# Patient Record
Sex: Female | Born: 1990 | Race: Black or African American | Hispanic: No | Marital: Single | Smoking: Former smoker
Health system: Southern US, Community
[De-identification: ages and names within clinical notes are randomized; demographics above are authoritative.]

## PROBLEM LIST (undated history)

## (undated) DIAGNOSIS — D649 Anemia, unspecified: Secondary | ICD-10-CM

---

## 2010-05-13 ENCOUNTER — Emergency Department (HOSPITAL_BASED_OUTPATIENT_CLINIC_OR_DEPARTMENT_OTHER): Admission: EM | Admit: 2010-05-13 | Discharge: 2010-05-13 | Payer: Self-pay | Admitting: Emergency Medicine

## 2014-10-01 ENCOUNTER — Emergency Department (HOSPITAL_BASED_OUTPATIENT_CLINIC_OR_DEPARTMENT_OTHER)
Admission: EM | Admit: 2014-10-01 | Discharge: 2014-10-01 | Disposition: A | Discharge status: Home / Self Care | Payer: Self-pay | Attending: Emergency Medicine | Admitting: Emergency Medicine

## 2014-10-01 ENCOUNTER — Encounter (HOSPITAL_BASED_OUTPATIENT_CLINIC_OR_DEPARTMENT_OTHER): Payer: Self-pay | Admitting: Emergency Medicine

## 2014-10-01 DIAGNOSIS — N760 Acute vaginitis: Secondary | ICD-10-CM | POA: Insufficient documentation

## 2014-10-01 DIAGNOSIS — Z720 Tobacco use: Secondary | ICD-10-CM | POA: Insufficient documentation

## 2014-10-01 DIAGNOSIS — R102 Pelvic and perineal pain: Secondary | ICD-10-CM

## 2014-10-01 DIAGNOSIS — Z862 Personal history of diseases of the blood and blood-forming organs and certain disorders involving the immune mechanism: Secondary | ICD-10-CM | POA: Insufficient documentation

## 2014-10-01 DIAGNOSIS — B9689 Other specified bacterial agents as the cause of diseases classified elsewhere: Secondary | ICD-10-CM

## 2014-10-01 DIAGNOSIS — Z3202 Encounter for pregnancy test, result negative: Secondary | ICD-10-CM | POA: Insufficient documentation

## 2014-10-01 HISTORY — DX: Anemia, unspecified: D64.9

## 2014-10-01 LAB — URINALYSIS, ROUTINE W REFLEX MICROSCOPIC
Bilirubin Urine: NEGATIVE
Glucose, UA: NEGATIVE mg/dL
Hgb urine dipstick: NEGATIVE
Ketones, ur: NEGATIVE mg/dL
LEUKOCYTES UA: NEGATIVE
NITRITE: NEGATIVE
PROTEIN: NEGATIVE mg/dL
SPECIFIC GRAVITY, URINE: 1.021 (ref 1.005–1.030)
UROBILINOGEN UA: 1 mg/dL (ref 0.0–1.0)
pH: 7 (ref 5.0–8.0)

## 2014-10-01 LAB — WET PREP, GENITAL
Trich, Wet Prep: NONE SEEN
YEAST WET PREP: NONE SEEN

## 2014-10-01 LAB — PREGNANCY, URINE: PREG TEST UR: NEGATIVE

## 2014-10-01 MED ORDER — METRONIDAZOLE 500 MG PO TABS: 500.0000 mg | ORAL_TABLET | Freq: Two times a day (BID) | ORAL | Status: DC

## 2014-10-01 NOTE — Discharge Instructions (Signed)
Bacterial Vaginosis Bacterial vaginosis is a vaginal infection that occurs when the normal balance of bacteria in the vagina is disrupted. It results from an overgrowth of certain bacteria. This is the most common vaginal infection in women of childbearing age. Treatment is important to prevent complications, especially in pregnant women, as it can cause a premature delivery. CAUSES  Bacterial vaginosis is caused by an increase in harmful bacteria that are normally present in smaller amounts in the vagina. Several different kinds of bacteria can cause bacterial vaginosis. However, the reason that the condition develops is not fully understood. RISK FACTORS Certain activities or behaviors can put you at an increased risk of developing bacterial vaginosis, including:  Having a new sex partner or multiple sex partners.  Douching.  Using an intrauterine device (IUD) for contraception. Women do not get bacterial vaginosis from toilet seats, bedding, swimming pools, or contact with objects around them. SIGNS AND SYMPTOMS  Some women with bacterial vaginosis have no signs or symptoms. Common symptoms include:  Grey vaginal discharge.  A fishlike odor with discharge, especially after sexual intercourse.  Itching or burning of the vagina and vulva.  Burning or pain with urination. DIAGNOSIS  Your health care provider will take a medical history and examine the vagina for signs of bacterial vaginosis. A sample of vaginal fluid may be taken. Your health care provider will look at this sample under a microscope to check for bacteria and abnormal cells. A vaginal pH test may also be done.  TREATMENT  Bacterial vaginosis may be treated with antibiotic medicines. These may be given in the form of a pill or a vaginal cream. A second round of antibiotics may be prescribed if the condition comes back after treatment.  HOME CARE INSTRUCTIONS   Only take over-the-counter or prescription medicines as  directed by your health care provider.  If antibiotic medicine was prescribed, take it as directed. Make sure you finish it even if you start to feel better.  Do not have sex until treatment is completed.  Tell all sexual partners that you have a vaginal infection. They should see their health care provider and be treated if they have problems, such as a mild rash or itching.  Practice safe sex by using condoms and only having one sex partner. SEEK MEDICAL CARE IF:   Your symptoms are not improving after 3 days of treatment.  You have increased discharge or pain.  You have a fever. MAKE SURE YOU:   Understand these instructions.  Will watch your condition.  Will get help right away if you are not doing well or get worse. FOR MORE INFORMATION  Centers for Disease Control and Prevention, Division of STD Prevention: [employer]raud[employer] American Sexual Health Association (ASHA): www.ashastd.org  Document Released: 12/17/2005 Document Revised: 10/07/2013 Document Reviewed: 07/29/2013 [facility] Patient Information 2015 [place], [place]. This information is not intended to replace advice given to you by your health care provider. Make sure you discuss any questions you have with your health care provider.  Pelvic Pain Pelvic pain is pain felt below the belly button and between your hips. It can be caused by many different things. It is important to get help right away. This is especially true for severe, sharp, or unusual pain that comes on suddenly.  HOME CARE  Only take medicine as told by your doctor.  Rest as told by your doctor.  Eat a healthy diet, such as fruits, vegetables, and lean meats.  Drink enough fluids to keep your pee (  urine) clear or pale yellow, or as told.  Avoid sex (intercourse) if it causes pain.  Apply warm or cold packs to your lower belly (abdomen). Use the type of pack that helps the pain.  Avoid situations that cause you stress.  Keep a journal to track  your pain. Write down:  When the pain started.  Where it is located.  If there are things that seem to be related to the pain, such as food or your period.  Follow up with your doctor as told. GET HELP RIGHT AWAY IF:   You have heavy bleeding from the vagina.  You have more pelvic pain.  You feel lightheaded or pass out (faint).  You have chills.  You have pain when you pee or have blood in your pee.  You cannot stop having watery poop (diarrhea).  You cannot stop throwing up (vomiting).  You have a fever or lasting symptoms for more than 3 days.  You have a fever and your symptoms suddenly get worse.  You are being physically or sexually abused.  Your medicine does not help your pain.  You have fluid (discharge) coming from your vagina that is not normal. MAKE SURE YOU:  Understand these instructions.  Will watch your condition.  Will get help if you are not doing well or get worse. Document Released: 06/04/2008 Document Revised: 06/17/2012 Document Reviewed: 04/07/2012 [facility] Patient Information 2015 [place], [place]. This information is not intended to replace advice given to you by your health care provider. Make sure you discuss any questions you have with your health care provider.

## 2014-10-01 NOTE — ED Provider Notes (Signed)
CSN: [id number]     Arrival date & time 10/01/14  1700 History   First MD Initiated Contact with Patient 10/01/14 1718     Chief Complaint  Patient presents with  . Abdominal Pain     (Consider location/radiation/quality/duration/timing/severity/associated sxs/prior Treatment) HPI Comments: Patient complains of pain to her lower abdomen. She states her last 2 days she's had some pain in her suprapubic area and her left lower quadrant. She states it waxes and wanes in intensity. She denies any nausea vomiting. She denies he fevers or chills. She denies any urinary symptoms. She missed her last tetanus shot in July and has not had a period since that time. She denies any vaginal bleeding or discharge. She's not taking anything over-the-counter for the pain.  Patient is a 23 y.o. female presenting with abdominal pain.  Abdominal Pain Associated symptoms: no chest pain, no chills, no cough, no diarrhea, no fatigue, no fever, no hematuria, no nausea, no shortness of breath, no vaginal bleeding, no vaginal discharge and no vomiting     Past Medical History  Diagnosis Date  . Anemia    Past Surgical History  Procedure Laterality Date  . Cesarean section     No family history on file. History  Substance Use Topics  . Smoking status: Current Every Day Smoker    Types: Cigars  . Smokeless tobacco: Not on file  . Alcohol Use: Yes     Comment: occ   OB History   Grav Para Term Preterm Abortions TAB SAB Ect Mult Living                 Review of Systems  Constitutional: Negative for fever, chills, diaphoresis and fatigue.  HENT: Negative for congestion, rhinorrhea and sneezing.   Eyes: Negative.   Respiratory: Negative for cough, chest tightness and shortness of breath.   Cardiovascular: Negative for chest pain and leg swelling.  Gastrointestinal: Positive for abdominal pain. Negative for nausea, vomiting, diarrhea and blood in stool.  Genitourinary: Negative for frequency, hematuria,  flank pain, vaginal bleeding, vaginal discharge and difficulty urinating.  Musculoskeletal: Negative for arthralgias and back pain.  Skin: Negative for rash.  Neurological: Negative for dizziness, speech difficulty, weakness, numbness and headaches.      Allergies  Review of patient's allergies indicates no known allergies.  Home Medications   Prior to Admission medications   Medication Sig Start Date End Date Taking? Authorizing Provider  metroNIDAZOLE (FLAGYL) 500 MG tablet Take 1 tablet (500 mg total) by mouth 2 (two) times daily. One po bid x 7 days 10/01/14   Rolan Bucco, MD   BP 122/75  Pulse 76  Temp(Src) 99.5 F (37.5 C) (Oral)  Resp 16  Ht 5' 4.75" (1.645 m)  Wt 115 lb (52.164 kg)  BMI 19.28 kg/m2  SpO2 99% Physical Exam  Constitutional: She is oriented to person, place, and time. She appears well-developed and well-nourished.  HENT:  Head: Normocephalic and atraumatic.  Eyes: Pupils are equal, round, and reactive to light.  Neck: Normal range of motion. Neck supple.  Cardiovascular: Normal rate, regular rhythm and normal heart sounds.   Pulmonary/Chest: Effort normal and breath sounds normal. No respiratory distress. She has no wheezes. She has no rales. She exhibits no tenderness.  Abdominal: Soft. Bowel sounds are normal. There is tenderness (mild TTP suprapubic and LLQ area, no CVA tenderness). There is no rebound and no guarding.  Genitourinary:  Small amount of thin white discharge.  No CMT, mild left adnexal  tenderness  Musculoskeletal: Normal range of motion. She exhibits no edema.  Lymphadenopathy:    She has no cervical adenopathy.  Neurological: She is alert and oriented to person, place, and time.  Skin: Skin is warm and dry. No rash noted.  Psychiatric: She has a normal mood and affect.    ED Course  Procedures (including critical care time) Labs Review Labs Reviewed  WET PREP, GENITAL - Abnormal; Notable for the following:    Clue Cells Wet  Prep HPF POC MODERATE (*)    WBC, Wet Prep HPF POC FEW (*)    All other components within normal limits  URINALYSIS, ROUTINE W REFLEX MICROSCOPIC - Abnormal; Notable for the following:    APPearance CLOUDY (*)    All other components within normal limits  GC/CHLAMYDIA PROBE AMP  PREGNANCY, URINE    Imaging Review No results found.   EKG Interpretation None      MDM   Final diagnoses:  Pelvic pain in female  BV (bacterial vaginosis)   Patient with a negative pregnancy test. She has positive evidence of bacterial vaginosis. She was started on Flagyl. [facility] if her symptoms are not improving or are she continues to be amenorrheic.    Rolan BuccoMelanie Nansi [place], MD 10/01/14 [phone]

## 2014-10-01 NOTE — ED Notes (Signed)
RLQ pain x2 days without N/V/D.  Mildly tender.

## 2014-10-02 LAB — GC/CHLAMYDIA PROBE AMP
CT Probe RNA: NEGATIVE
GC Probe RNA: NEGATIVE

## 2014-12-01 ENCOUNTER — Emergency Department (HOSPITAL_BASED_OUTPATIENT_CLINIC_OR_DEPARTMENT_OTHER): Payer: Self-pay

## 2014-12-01 ENCOUNTER — Encounter (HOSPITAL_BASED_OUTPATIENT_CLINIC_OR_DEPARTMENT_OTHER): Payer: Self-pay | Admitting: *Deleted

## 2014-12-01 ENCOUNTER — Emergency Department (HOSPITAL_BASED_OUTPATIENT_CLINIC_OR_DEPARTMENT_OTHER)
Admission: EM | Admit: 2014-12-01 | Discharge: 2014-12-01 | Disposition: A | Discharge status: Home / Self Care | Payer: Self-pay | Attending: Emergency Medicine | Admitting: Emergency Medicine

## 2014-12-01 DIAGNOSIS — N83209 Unspecified ovarian cyst, unspecified side: Secondary | ICD-10-CM

## 2014-12-01 DIAGNOSIS — Z3202 Encounter for pregnancy test, result negative: Secondary | ICD-10-CM | POA: Insufficient documentation

## 2014-12-01 DIAGNOSIS — Z862 Personal history of diseases of the blood and blood-forming organs and certain disorders involving the immune mechanism: Secondary | ICD-10-CM | POA: Insufficient documentation

## 2014-12-01 DIAGNOSIS — Z9889 Other specified postprocedural states: Secondary | ICD-10-CM | POA: Insufficient documentation

## 2014-12-01 DIAGNOSIS — N939 Abnormal uterine and vaginal bleeding, unspecified: Secondary | ICD-10-CM

## 2014-12-01 DIAGNOSIS — N830 Follicular cyst of ovary: Secondary | ICD-10-CM | POA: Insufficient documentation

## 2014-12-01 DIAGNOSIS — Z720 Tobacco use: Secondary | ICD-10-CM | POA: Insufficient documentation

## 2014-12-01 LAB — COMPREHENSIVE METABOLIC PANEL
ALT: 11 U/L (ref 0–35)
AST: 21 U/L (ref 0–37)
Albumin: 3.9 g/dL (ref 3.5–5.2)
Alkaline Phosphatase: 63 U/L (ref 39–117)
Anion gap: 11 (ref 5–15)
BILIRUBIN TOTAL: 0.2 mg/dL — AB (ref 0.3–1.2)
BUN: 12 mg/dL (ref 6–23)
CALCIUM: 9.2 mg/dL (ref 8.4–10.5)
CHLORIDE: 105 meq/L (ref 96–112)
CO2: 26 meq/L (ref 19–32)
CREATININE: 0.7 mg/dL (ref 0.50–1.10)
GLUCOSE: 94 mg/dL (ref 70–99)
Potassium: 4 mEq/L (ref 3.7–5.3)
Sodium: 142 mEq/L (ref 137–147)
Total Protein: 7.6 g/dL (ref 6.0–8.3)

## 2014-12-01 LAB — WET PREP, GENITAL
Trich, Wet Prep: NONE SEEN
YEAST WET PREP: NONE SEEN

## 2014-12-01 LAB — HCG, QUANTITATIVE, PREGNANCY

## 2014-12-01 LAB — CBC
HCT: 35.8 % — ABNORMAL LOW (ref 36.0–46.0)
HEMOGLOBIN: 11.8 g/dL — AB (ref 12.0–15.0)
MCH: 27.6 pg (ref 26.0–34.0)
MCHC: 33 g/dL (ref 30.0–36.0)
MCV: 83.8 fL (ref 78.0–100.0)
PLATELETS: 262 10*3/uL (ref 150–400)
RBC: 4.27 MIL/uL (ref 3.87–5.11)
RDW: 14 % (ref 11.5–15.5)
WBC: 6.5 10*3/uL (ref 4.0–10.5)

## 2014-12-01 NOTE — Discharge Instructions (Signed)
You should follow-up with your gynecologist in the next 2-3 weeks to be rechecked, and the radiologist is recommending a repeat ultrasound in 3 months to reevaluate the cyst.  Return to the emergency department if you develop worsening bleeding, worsening pain, high fever, or other new and concerning symptoms.   Ovarian Cyst An ovarian cyst is a fluid-filled sac that forms on an ovary. The ovaries are small organs that produce eggs in women. Various types of cysts can form on the ovaries. Most are not cancerous. Many do not cause problems, and they often go away on their own. Some may cause symptoms and require treatment. Common types of ovarian cysts include:  Functional cysts--These cysts may occur every month during the menstrual cycle. This is normal. The cysts usually go away with the next menstrual cycle if the woman does not get pregnant. Usually, there are no symptoms with a functional cyst.  Endometrioma cysts--These cysts form from the tissue that lines the uterus. They are also called "chocolate cysts" because they become filled with blood that turns brown. This type of cyst can cause pain in the lower abdomen during intercourse and with your menstrual period.  Cystadenoma cysts--This type develops from the cells on the outside of the ovary. These cysts can get very big and cause lower abdomen pain and pain with intercourse. This type of cyst can twist on itself, cut off its blood supply, and cause severe pain. It can also easily rupture and cause a lot of pain.  Dermoid cysts--This type of cyst is sometimes found in both ovaries. These cysts may contain different kinds of body tissue, such as skin, teeth, hair, or cartilage. They usually do not cause symptoms unless they get very big.  Theca lutein cysts--These cysts occur when too much of a certain hormone (human chorionic gonadotropin) is produced and overstimulates the ovaries to produce an egg. This is most common after procedures  used to assist with the conception of a baby (in vitro fertilization). CAUSES   Fertility drugs can cause a condition in which multiple large cysts are formed on the ovaries. This is called ovarian hyperstimulation syndrome.  A condition called polycystic ovary syndrome can cause hormonal imbalances that can lead to nonfunctional ovarian cysts. SIGNS AND SYMPTOMS  Many ovarian cysts do not cause symptoms. If symptoms are present, they may include:  Pelvic pain or pressure.  Pain in the lower abdomen.  Pain during sexual intercourse.  Increasing girth (swelling) of the abdomen.  Abnormal menstrual periods.  Increasing pain with menstrual periods.  Stopping having menstrual periods without being pregnant. DIAGNOSIS  These cysts are commonly found during a routine or annual pelvic exam. Tests may be ordered to find out more about the cyst. These tests may include:  Ultrasound.  X-ray of the pelvis.  CT scan.  MRI.  Blood tests. TREATMENT  Many ovarian cysts go away on their own without treatment. Your health care provider may want to check your cyst regularly for 2-3 months to see if it changes. For women in menopause, it is particularly important to monitor a cyst closely because of the higher rate of ovarian cancer in menopausal women. When treatment is needed, it may include any of the following:  A procedure to drain the cyst (aspiration). This may be done using a long needle and ultrasound. It can also be done through a laparoscopic procedure. This involves using a thin, lighted tube with a tiny camera on the end (laparoscope) inserted through a small  incision.  Surgery to remove the whole cyst. This may be done using laparoscopic surgery or an open surgery involving a larger incision in the lower abdomen.  Hormone treatment or birth control pills. These methods are sometimes used to help dissolve a cyst. HOME CARE INSTRUCTIONS   Only take over-the-counter or  prescription medicines as directed by your health care provider.  Follow up with your health care provider as directed.  Get regular pelvic exams and Pap tests. SEEK MEDICAL CARE IF:   Your periods are late, irregular, or painful, or they stop.  Your pelvic pain or abdominal pain does not go away.  Your abdomen becomes larger or swollen.  You have pressure on your bladder or trouble emptying your bladder completely.  You have pain during sexual intercourse.  You have feelings of fullness, pressure, or discomfort in your stomach.  You lose weight for no apparent reason.  You feel generally ill.  You become constipated.  You lose your appetite.  You develop acne.  You have an increase in body and facial hair.  You are gaining weight, without changing your exercise and eating habits.  You think you are pregnant. SEEK IMMEDIATE MEDICAL CARE IF:   You have increasing abdominal pain.  You feel sick to your stomach (nauseous), and you throw up (vomit).  You develop a fever that comes on suddenly.  You have abdominal pain during a bowel movement.  Your menstrual periods become heavier than usual. MAKE SURE YOU:  Understand these instructions.  Will watch your condition.  Will get help right away if you are not doing well or get worse. Document Released: 12/17/2005 Document Revised: 12/22/2013 Document Reviewed: 08/24/2013 [facility] Patient Information 2015 [place], [place]. This information is not intended to replace advice given to you by your health care provider. Make sure you discuss any questions you have with your health care provider.

## 2014-12-01 NOTE — ED Notes (Signed)
Pt states vaginal bleeding with positive home preg x 1 day ago

## 2014-12-01 NOTE — ED Provider Notes (Signed)
CSN: [id number]     Arrival date & time 12/01/14  1816 History   First MD Initiated Contact with Patient 12/01/14 1928     This chart was scribed for Geoffery Lyonsouglas Brittanya Winburn, MD by Arlan OrganAshley Leger, ED Scribe. This patient was seen in room MH09/MH09 and the patient's care was started 7:29 PM.   Chief Complaint  Patient presents with  . Vaginal Bleeding    Patient is a 23 y.o. female presenting with vaginal bleeding. The history is provided by the patient. No language interpreter was used.  Vaginal Bleeding Quality:  Bright red and dark red Severity:  Moderate Onset quality:  Sudden Duration:  1 day Timing:  Constant Progression:  Unchanged Chronicity:  New Menstrual history:  Irregular Possible pregnancy: yes   Context: at rest   Relieved by:  None tried Worsened by:  Nothing tried Ineffective treatments:  None tried Associated symptoms: abdominal pain and vaginal discharge   Associated symptoms: no dysuria, no fever and no nausea     HPI Comments: Gina PateJasmine Ortiz is a 23 y.o. female with a PMHx of anemia who presents to the Emergency Department complaining of constant, moderate vaginal bleeding x 1 day. States blood was bright red on toilet paper and in the toilet. She also reports abdominal cramping/pressure and a mild white vaginal discharge. Pt states she took an at home pregnancy test with positive results 1 week ago. However, she has not followed up with OB/GYN to confirm. She denies any fever, chills, or dysuria. Pt unaware of LNMP as she was previously on the Depo shot. However, she was due for a shot on July and missed her appointment. She is sexually active with one partner for last year. No known allergies to medications.  Past Medical History  Diagnosis Date  . Anemia    Past Surgical History  Procedure Laterality Date  . Cesarean section     History reviewed. No pertinent family history. History  Substance Use Topics  . Smoking status: Current Some Day Smoker -- 0.50 packs/day     Types: Cigars  . Smokeless tobacco: Not on file  . Alcohol Use: Yes     Comment: occ   OB History    No data available     Review of Systems  Constitutional: Negative for fever and chills.  Gastrointestinal: Positive for abdominal pain. Negative for nausea.  Genitourinary: Positive for vaginal bleeding and vaginal discharge. Negative for dysuria.  All other systems reviewed and are negative.     Allergies  Review of patient's allergies indicates no known allergies.  Home Medications   Prior to Admission medications   Medication Sig Start Date End Date Taking? Authorizing Provider  metroNIDAZOLE (FLAGYL) 500 MG tablet Take 1 tablet (500 mg total) by mouth 2 (two) times daily. One po bid x 7 days 10/01/14   Rolan BuccoMelanie Belfi, MD   Triage Vitals: LMP 12/01/2014   Physical Exam  Constitutional: She is oriented to person, place, and time. She appears well-developed and well-nourished. No distress.  HENT:  Head: Normocephalic and atraumatic.  Eyes: EOM are normal.  Neck: Normal range of motion.  Cardiovascular: Normal rate, regular rhythm and normal heart sounds.   Pulmonary/Chest: Effort normal and breath sounds normal.  Abdominal: Soft. Bowel sounds are normal. She exhibits no distension. There is no tenderness.  Mild tenderness to LLQ  Genitourinary:  Dark blood in vaginal vault. There is mild L adnexal tenderness but no masses. No cervical motion tenderness.  Musculoskeletal: Normal range of motion.  Neurological: She is alert and oriented to person, place, and time.  Skin: Skin is warm and dry.  Psychiatric: She has a normal mood and affect. Judgment normal.  Nursing note and vitals reviewed.   ED Course  Procedures (including critical care time)  DIAGNOSTIC STUDIES:   COORDINATION OF CARE: 7:28 PM-Discussed treatment plan with pt at bedside and pt agreed to plan.     Labs Review Labs Reviewed  CBC - Abnormal; Notable for the following:    Hemoglobin 11.8  (*)    HCT 35.8 (*)    All other components within normal limits  COMPREHENSIVE METABOLIC PANEL - Abnormal; Notable for the following:    Total Bilirubin 0.2 (*)    All other components within normal limits  HCG, QUANTITATIVE, PREGNANCY  URINALYSIS, ROUTINE W REFLEX MICROSCOPIC  PREGNANCY, URINE    Imaging Review No results found.   EKG Interpretation None      MDM   Final diagnoses:  None    Patient presents with lower abdominal pain and vaginal bleeding. She states she had a pregnancy test at home that was positive last week. Today's serum hCG was negative and laboratory studies are essentially unremarkable. Ultrasound of the pelvis reveals what appears to be a hemorrhagic ovarian cyst. She was advised to follow-up with her GYN for a recheck of this cyst in the next few weeks, and return to the ER for symptoms substantially worsen or change.  I personally performed the services described in this documentation, which was scribed in my presence. The recorded information has been reviewed and is accurate.    Geoffery Lyonsouglas Terriyah Westra, MD 12/01/14 2153

## 2014-12-02 LAB — GC/CHLAMYDIA PROBE AMP
CT PROBE, AMP APTIMA: NEGATIVE
GC Probe RNA: NEGATIVE

## 2014-12-02 LAB — HIV ANTIBODY (ROUTINE TESTING W REFLEX): HIV: NONREACTIVE

## 2015-02-15 ENCOUNTER — Encounter (HOSPITAL_BASED_OUTPATIENT_CLINIC_OR_DEPARTMENT_OTHER): Payer: Self-pay | Admitting: *Deleted

## 2015-02-15 ENCOUNTER — Emergency Department (HOSPITAL_BASED_OUTPATIENT_CLINIC_OR_DEPARTMENT_OTHER)
Admission: EM | Admit: 2015-02-15 | Discharge: 2015-02-15 | Disposition: A | Discharge status: Home / Self Care | Payer: Self-pay | Attending: Emergency Medicine | Admitting: Emergency Medicine

## 2015-02-15 DIAGNOSIS — Z3202 Encounter for pregnancy test, result negative: Secondary | ICD-10-CM | POA: Insufficient documentation

## 2015-02-15 DIAGNOSIS — Z792 Long term (current) use of antibiotics: Secondary | ICD-10-CM | POA: Insufficient documentation

## 2015-02-15 DIAGNOSIS — Z862 Personal history of diseases of the blood and blood-forming organs and certain disorders involving the immune mechanism: Secondary | ICD-10-CM | POA: Insufficient documentation

## 2015-02-15 DIAGNOSIS — N760 Acute vaginitis: Secondary | ICD-10-CM | POA: Insufficient documentation

## 2015-02-15 DIAGNOSIS — B9689 Other specified bacterial agents as the cause of diseases classified elsewhere: Secondary | ICD-10-CM

## 2015-02-15 DIAGNOSIS — Z720 Tobacco use: Secondary | ICD-10-CM | POA: Insufficient documentation

## 2015-02-15 LAB — WET PREP, GENITAL
TRICH WET PREP: NONE SEEN
YEAST WET PREP: NONE SEEN

## 2015-02-15 LAB — URINALYSIS, ROUTINE W REFLEX MICROSCOPIC
BILIRUBIN URINE: NEGATIVE
Glucose, UA: NEGATIVE mg/dL
KETONES UR: NEGATIVE mg/dL
Leukocytes, UA: NEGATIVE
Nitrite: NEGATIVE
PH: 7.5 (ref 5.0–8.0)
PROTEIN: NEGATIVE mg/dL
SPECIFIC GRAVITY, URINE: 1.012 (ref 1.005–1.030)
Urobilinogen, UA: 0.2 mg/dL (ref 0.0–1.0)

## 2015-02-15 LAB — URINE MICROSCOPIC-ADD ON

## 2015-02-15 LAB — PREGNANCY, URINE: PREG TEST UR: NEGATIVE

## 2015-02-15 MED ORDER — METRONIDAZOLE 500 MG PO TABS: 500.0000 mg | ORAL_TABLET | Freq: Two times a day (BID) | ORAL | Status: DC

## 2015-02-15 NOTE — ED Notes (Signed)
Vaginal discharge on and off x 2 months.

## 2015-02-15 NOTE — ED Provider Notes (Signed)
CSN: [id number]     Arrival date & time 02/15/15  1453 History   First MD Initiated Contact with Patient 02/15/15 1717     Chief Complaint  Patient presents with  . Vaginal Discharge     (Consider location/radiation/quality/duration/timing/severity/associated sxs/prior Treatment) HPI Gina PateJasmine Ortiz is a 24 year old female with past history of anemia who presents the ER complaining of vaginal discharge. Patient states she has also diagnoses of ovarian cysts which were found to months ago. Patient reports having some mild vaginal discharge when she found that she had the ovarian cysts. Asian states over the past 2 weeks she has noticed a clear vaginal discharge, daily basis which is consistent with the discharge she has had in the past. Patient states she has not been sexually active since December of last year. Patient is states she is concerned that she is having similar symptoms when she was diagnosed with ovarian cysts.  Patient denies abdominal pain, pelvic pain, vaginal bleeding, dysuria, nausea, vomiting, diarrhea, fever.  Past Medical History  Diagnosis Date  . Anemia    Past Surgical History  Procedure Laterality Date  . Cesarean section     No family history on file. History  Substance Use Topics  . Smoking status: Current Some Day Smoker -- 0.50 packs/day    Types: Cigars  . Smokeless tobacco: Not on file  . Alcohol Use: Yes     Comment: occ   OB History    No data available     Review of Systems  Constitutional: Negative for fever.  HENT: Negative for trouble swallowing.   Eyes: Negative for visual disturbance.  Respiratory: Negative for shortness of breath.   Cardiovascular: Negative for chest pain.  Gastrointestinal: Negative for nausea, vomiting and abdominal pain.  Genitourinary: Positive for vaginal discharge. Negative for dysuria.  Musculoskeletal: Negative for neck pain.  Skin: Negative for rash.  Neurological: Negative for dizziness, weakness and  numbness.  Psychiatric/Behavioral: Negative.       Allergies  Review of patient's allergies indicates no known allergies.  Home Medications   Prior to Admission medications   Medication Sig Start Date End Date Taking? Authorizing Provider  metroNIDAZOLE (FLAGYL) 500 MG tablet Take 1 tablet (500 mg total) by mouth 2 (two) times daily. One po bid x 7 days 10/01/14   Rolan BuccoMelanie Belfi, MD  metroNIDAZOLE (FLAGYL) 500 MG tablet Take 1 tablet (500 mg total) by mouth 2 (two) times daily. One po bid x 7 days 02/15/15   Monte FantasiaJoseph W Kerston Landeck, PA-C   BP 130/64 mmHg  Pulse 68  Temp(Src) 98.1 F (36.7 C) (Oral)  Resp 18  Ht 5\' 5"  (1.651 m)  Wt 113 lb (51.256 kg)  BMI 18.80 kg/m2  SpO2 100%  LMP 12/31/2014 Physical Exam  Constitutional: She is oriented to person, place, and time. She appears well-developed and well-nourished. No distress.  HENT:  Head: Normocephalic and atraumatic.  Mouth/Throat: Oropharynx is clear and moist. No oropharyngeal exudate.  Eyes: Right eye exhibits no discharge. Left eye exhibits no discharge. No scleral icterus.  Neck: Normal range of motion.  Cardiovascular: Normal rate, regular rhythm and normal heart sounds.   No murmur heard. Pulmonary/Chest: Effort normal and breath sounds normal. No respiratory distress.  Abdominal: Soft. Normal appearance and bowel sounds are normal. There is no tenderness.  Genitourinary: There is no rash, tenderness, lesion or injury on the right labia. There is no rash, tenderness, lesion or injury on the left labia. Cervix exhibits no motion tenderness, no discharge and  no friability. Right adnexum displays no mass, no tenderness and no fullness. Left adnexum displays no mass, no tenderness and no fullness. No erythema, tenderness or bleeding in the vagina. No foreign body around the vagina. No signs of injury around the vagina. Vaginal discharge found.  Moderate amount of white colored discharge noted in vaginal vault. No cervical motion  tenderness, friability or discharge. No adnexal tenderness. Chaperone present during entire pelvic exam.  Musculoskeletal: Normal range of motion. She exhibits no edema or tenderness.  Neurological: She is alert and oriented to person, place, and time. No cranial nerve deficit. Coordination normal.  Skin: Skin is warm and dry. No rash noted. She is not diaphoretic.  Psychiatric: She has a normal mood and affect.    ED Course  Procedures (including critical care time) Labs Review Labs Reviewed  WET PREP, GENITAL - Abnormal; Notable for the following:    Clue Cells Wet Prep HPF POC MODERATE (*)    WBC, Wet Prep HPF POC MODERATE (*)    All other components within normal limits  URINALYSIS, ROUTINE W REFLEX MICROSCOPIC - Abnormal; Notable for the following:    APPearance CLOUDY (*)    Hgb urine dipstick TRACE (*)    All other components within normal limits  URINE MICROSCOPIC-ADD ON - Abnormal; Notable for the following:    Squamous Epithelial / LPF FEW (*)    All other components within normal limits  PREGNANCY, URINE  GC/CHLAMYDIA PROBE AMP ([place])    Imaging Review No results found.   EKG Interpretation None      MDM   Final diagnoses:  BV (bacterial vaginosis)    Patient here with complaint of vaginal discharge without pain. No abdominal pain. Abdominal exam benign, no concern for acute abdomen. No concern for PID or ectopic pregnancy. No concern for acute abdomen or surgical abdomen. GU exam remarkable for some mild discharge, moderate clue cells noted on wet prep. We'll treat patient for bacterial vaginosis. Strongly encouraged patient to follow with her OB/GYN regarding her ovarian cysts which she was curious about tonight, with patient not having any symptoms regarding her cysts, there is no emergent need for ultrasound to evaluate them here. I discussed return precautions with patient, and patient verbalizes understanding and agreement of this plan. I encouraged  patient to call or return to ER should she have any questions or concerns.  BP 130/64 mmHg  Pulse 68  Temp(Src) 98.1 F (36.7 C) (Oral)  Resp 18  Ht  (1.651 m)  Wt 113 lb (51.256 kg)  BMI 18.80 kg/m2  SpO2 100%  LMP 12/31/2014  Signed,  Ladona Mow, PA-C 1:06 AM   Monte Fantasia, PA-C 02/16/15 0106  Gerhard Munch, MD 02/16/15 [phone]

## 2015-02-15 NOTE — Discharge Instructions (Signed)
[facility] or with your [facility]/GYN. Return to the ER if any severe abdominal pain, worsening of symptoms, vaginal bleeding, high fever.  Bacterial Vaginosis Bacterial vaginosis is a vaginal infection that occurs when the normal balance of bacteria in the vagina is disrupted. It results from an overgrowth of certain bacteria. This is the most common vaginal infection in women of childbearing age. Treatment is important to prevent complications, especially in pregnant women, as it can cause a premature delivery. CAUSES  Bacterial vaginosis is caused by an increase in harmful bacteria that are normally present in smaller amounts in the vagina. Several different kinds of bacteria can cause bacterial vaginosis. However, the reason that the condition develops is not fully understood. RISK FACTORS Certain activities or behaviors can put you at an increased risk of developing bacterial vaginosis, including:  Having a new sex partner or multiple sex partners.  Douching.  Using an intrauterine device (IUD) for contraception. Women do not get bacterial vaginosis from toilet seats, bedding, swimming pools, or contact with objects around them. SIGNS AND SYMPTOMS  Some women with bacterial vaginosis have no signs or symptoms. Common symptoms include:  Grey vaginal discharge.  A fishlike odor with discharge, especially after sexual intercourse.  Itching or burning of the vagina and vulva.  Burning or pain with urination. DIAGNOSIS  Your health care provider will take a medical history and examine the vagina for signs of bacterial vaginosis. A sample of vaginal fluid may be taken. Your health care provider will look at this sample under a microscope to check for bacteria and abnormal cells. A vaginal pH test may also be done.  TREATMENT  Bacterial vaginosis may be treated with antibiotic medicines. These may be given in the form of a pill or a vaginal cream. A second round of  antibiotics may be prescribed if the condition comes back after treatment.  HOME CARE INSTRUCTIONS   Only take over-the-counter or prescription medicines as directed by your health care provider.  If antibiotic medicine was prescribed, take it as directed. Make sure you finish it even if you start to feel better.  Do not have sex until treatment is completed.  Tell all sexual partners that you have a vaginal infection. They should see their health care provider and be treated if they have problems, such as a mild rash or itching.  Practice safe sex by using condoms and only having one sex partner. SEEK MEDICAL CARE IF:   Your symptoms are not improving after 3 days of treatment.  You have increased discharge or pain.  You have a fever. MAKE SURE YOU:   Understand these instructions.  Will watch your condition.  Will get help right away if you are not doing well or get worse. FOR MORE INFORMATION  Centers for Disease Control and Prevention, Division of STD Prevention: SolutionApps.co.zawww.cdc.gov/std American Sexual Health Association (ASHA): www.ashastd.org  Document Released: 12/17/2005 Document Revised: 10/07/2013 Document Reviewed: 07/29/2013 [facility] Patient Information 2015 [place], [place]. This information is not intended to replace advice given to you by your health care provider. Make sure you discuss any questions you have with your health care provider.

## 2015-02-16 LAB — GC/CHLAMYDIA PROBE AMP (~~LOC~~) NOT AT ARMC
Chlamydia: NEGATIVE
Neisseria Gonorrhea: NEGATIVE

## 2015-12-06 IMAGING — US US TRANSVAGINAL NON-OB
1 series · 13 of 25 positions shown · non-contrast
Comparison: None

CLINICAL DATA: Menorrhagia. Reported positive pregnancy test last
week; reported pregnancy test today negative

EXAM:
TRANSABDOMINAL AND TRANSVAGINAL ULTRASOUND OF PELVIS
TECHNIQUE: Study was performed transabdominally to optimize pelvic field of
view evaluation and transvaginally to optimize internal visceral
architecture evaluation.

[Series 1: us transvaginal non-ob · 0.18mm/px · 13 of 50 slices shown]
[im 1/50]
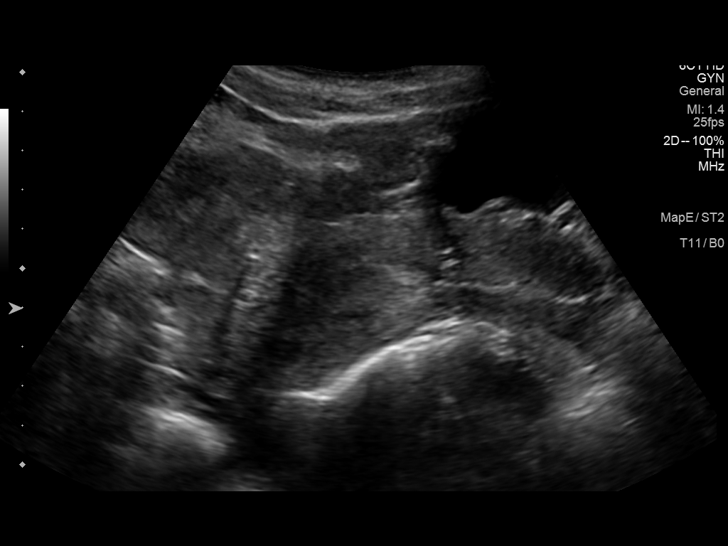
[im 5/50]
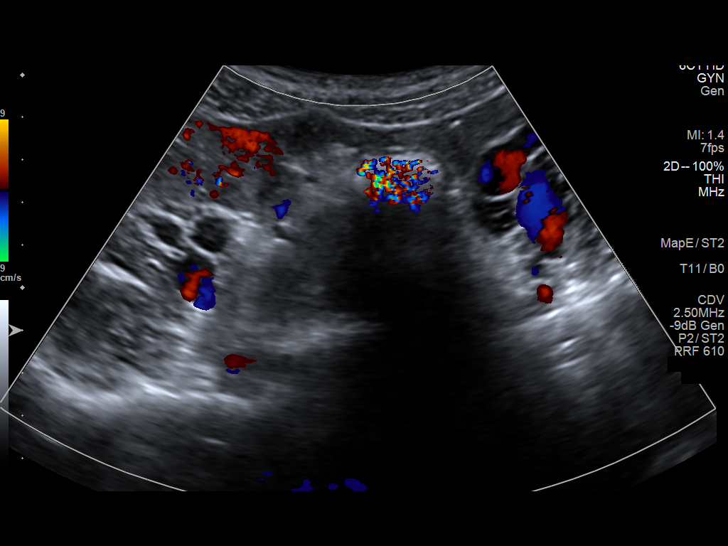
[im 9/50]
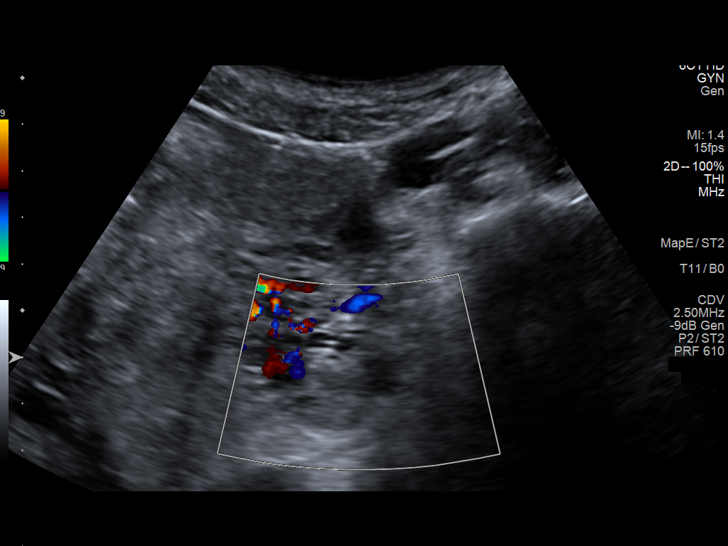
[im 13/50]
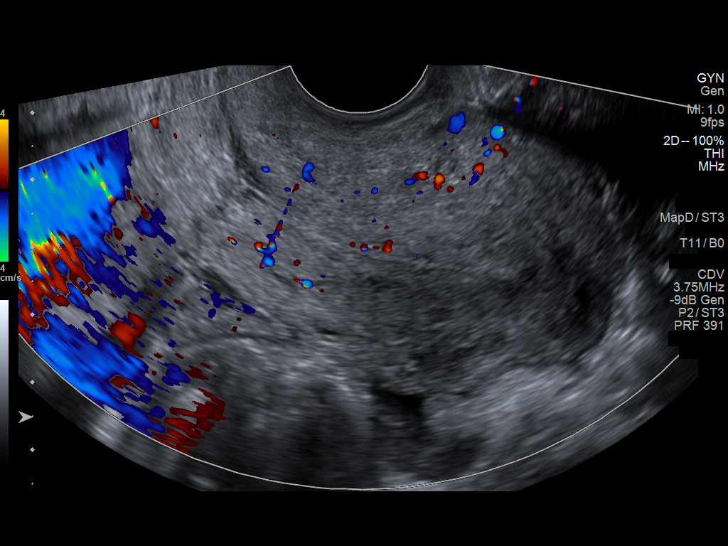
[im 17/50]
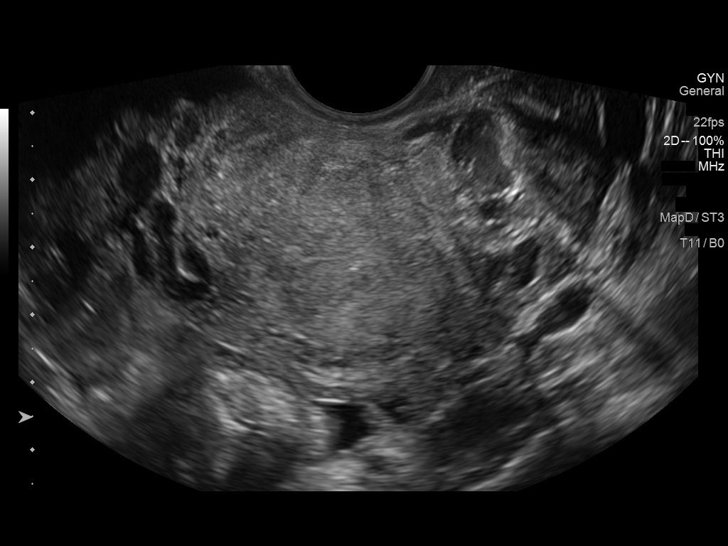
[im 21/50]
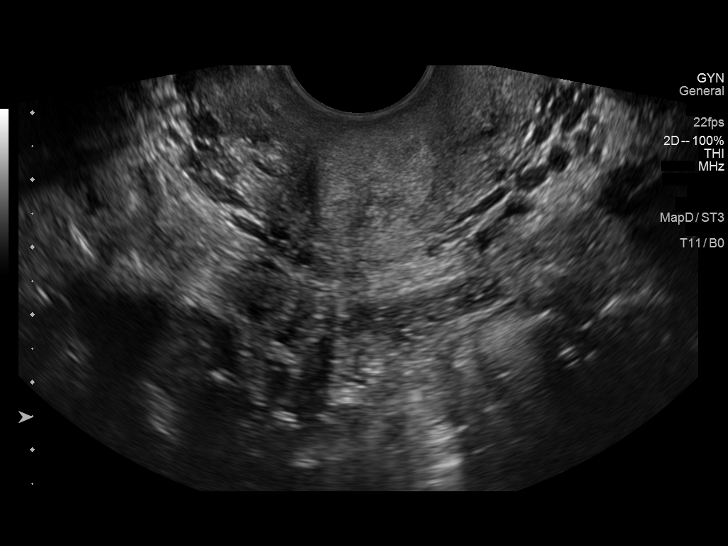
[im 25/50]
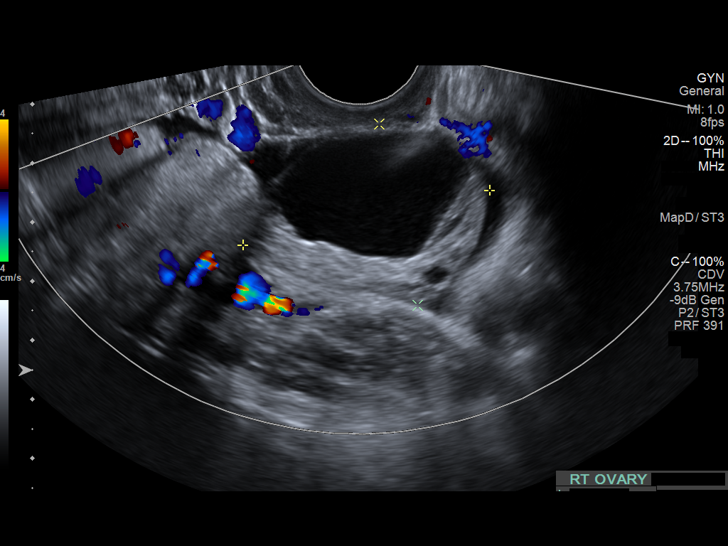
[im 29/50]
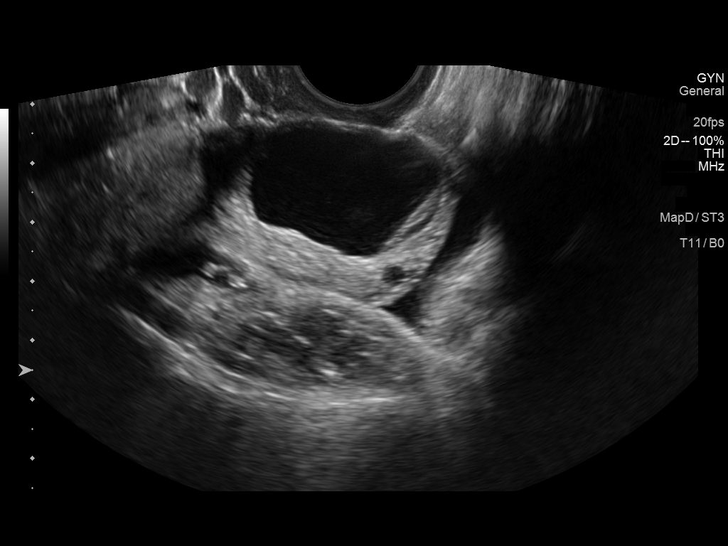
[im 33/50]
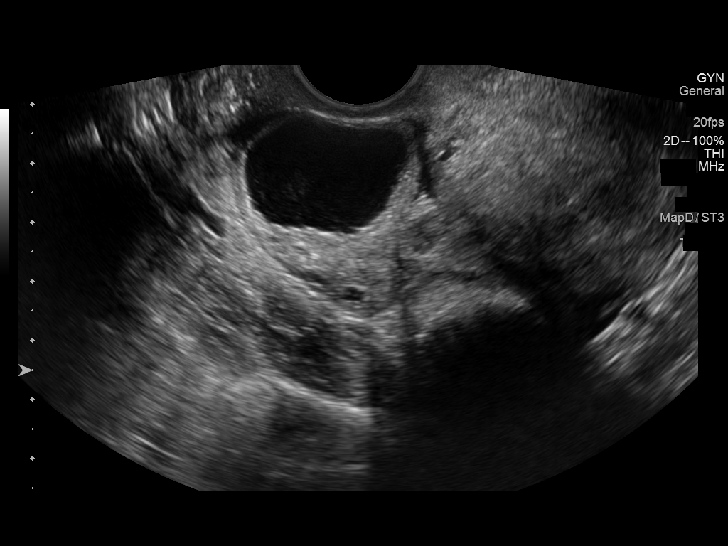
[im 37/50]
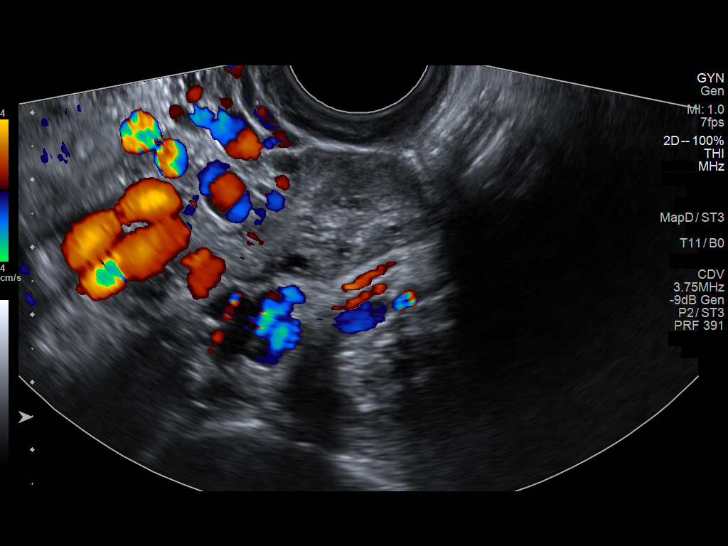
[im 41/50]
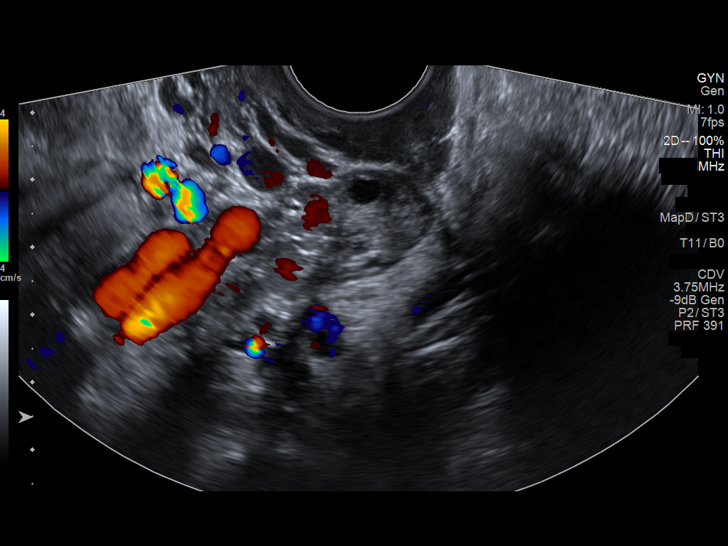
[im 45/50]
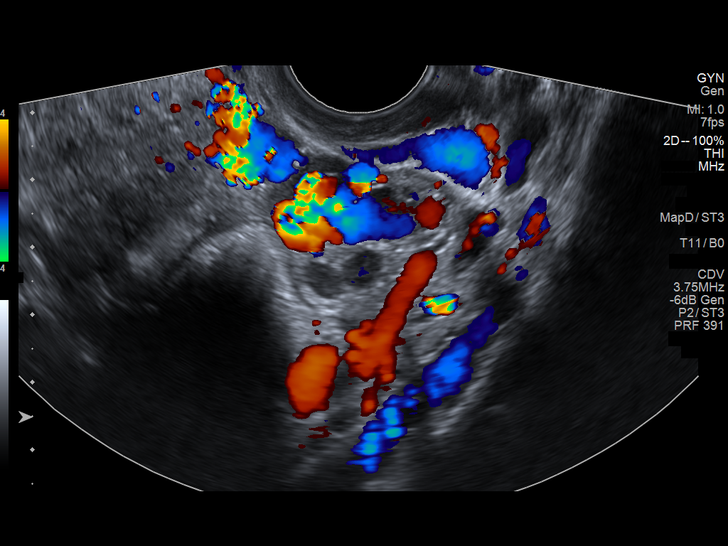
[im 50/50]
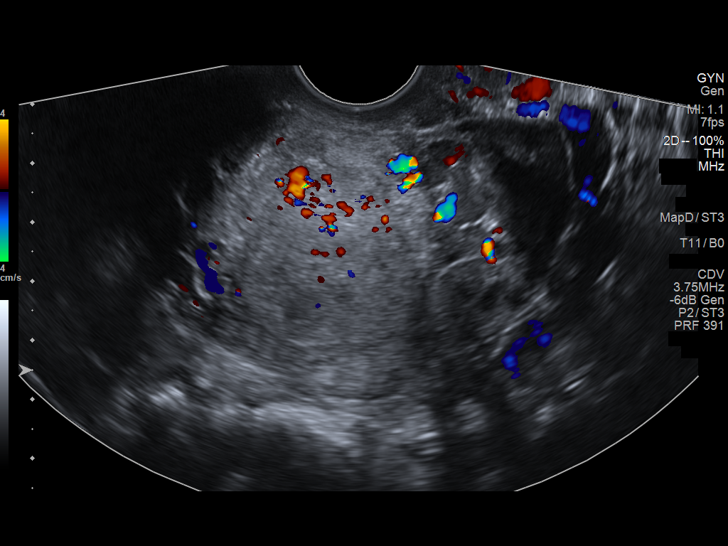

[13 of 25 positions shown; findings below may reference images not displayed]

FINDINGS: Uterus

Measurements: 7.0 x 3.9 x 3.5 cm. No fibroids or other mass
visualized. Uterus is retroverted.

Endometrium

Thickness: 4 mm. No focal abnormality visualized. Contour of the
endometrium is smooth.

Right ovary

Measurements: 4.3 x 3.1 x 3.5 cm. There is a predominantly cystic
right adnexal mass measuring 3.0 x 2.2 x 3.0 cm which contains
debris and a single mildly thickened septation.

Left ovary

Measurements: 2.5 x 2.5 x 1.7 cm. Normal appearance/no adnexal mass.

Other findings

There is a small amount of free fluid adjacent to the right ovary.
IMPRESSION: Probable hemorrhagic cyst arising from the right ovary. A small
amount of nearby fluid may indicate recent ovarian cyst leakage.
Short-interval follow up ultrasound in 6-12 weeks is recommended,
preferably during the week following the patient's normal menses.

Study otherwise unremarkable. In particular, the endometrium appears
normal. There is no intrauterine or endometrial mass or apparent
intrauterine gestation.

## 2021-08-21 ENCOUNTER — Encounter (HOSPITAL_BASED_OUTPATIENT_CLINIC_OR_DEPARTMENT_OTHER): Payer: Self-pay | Admitting: Urology

## 2021-08-21 ENCOUNTER — Emergency Department (HOSPITAL_BASED_OUTPATIENT_CLINIC_OR_DEPARTMENT_OTHER)
Admission: EM | Admit: 2021-08-21 | Discharge: 2021-08-21 | Disposition: A | Discharge status: Home / Self Care | Payer: Medicaid Other | Attending: Emergency Medicine | Admitting: Emergency Medicine

## 2021-08-21 ENCOUNTER — Other Ambulatory Visit: Payer: Self-pay

## 2021-08-21 ENCOUNTER — Emergency Department (HOSPITAL_BASED_OUTPATIENT_CLINIC_OR_DEPARTMENT_OTHER): Payer: Medicaid Other

## 2021-08-21 DIAGNOSIS — M25531 Pain in right wrist: Secondary | ICD-10-CM | POA: Diagnosis not present

## 2021-08-21 DIAGNOSIS — Z87891 Personal history of nicotine dependence: Secondary | ICD-10-CM | POA: Insufficient documentation

## 2021-08-21 DIAGNOSIS — M79631 Pain in right forearm: Secondary | ICD-10-CM | POA: Insufficient documentation

## 2021-08-21 DIAGNOSIS — R2231 Localized swelling, mass and lump, right upper limb: Secondary | ICD-10-CM | POA: Diagnosis not present

## 2021-08-21 DIAGNOSIS — M79601 Pain in right arm: Secondary | ICD-10-CM | POA: Insufficient documentation

## 2021-08-21 MED ORDER — DICLOFENAC SODIUM 1 % EX GEL: 4.0000 g | Freq: Four times a day (QID) | CUTANEOUS | 0 refills | Status: AC

## 2021-08-21 NOTE — ED Provider Notes (Signed)
[facility] EMERGENCY DEPARTMENT Provider Note   CSN: [id number] Arrival date & time: 08/21/21  1315     History Chief Complaint  Patient presents with   Arm Injury    Gina Ortiz is a 30 y.o. female.  HPI Patient is a 30 year old female presenting today with right wrist/forearm pain for the past 10 days.  She states that she works at a [employer] and does repetitive rolling on a daily basis  She states that the pain is achy and worse after work.  She states that she has no numbness or weakness of her hand or wrist.  She states that it is only mildly uncomfortable at this time.  She states that she noticed a little bit of swelling in her right wrist after one of her recent shifts.  She states that she used some ice to the area and had some improvement.  She denies any chest pain shortness of breath No recent surgeries, hospitalization, long travel, hemoptysis, estrogen containing OCP, cancer history.  No unilateral leg swelling.  No history of PE or VTE.       Past Medical History:  Diagnosis Date   Anemia     There are no problems to display for this patient.   Past Surgical History:  Procedure Laterality Date   CESAREAN SECTION       OB History   No obstetric history on file.     History reviewed. No pertinent family history.  Social History   Tobacco Use   Smoking status: Former    Packs/day: 0.50    Types: Cigars, Cigarettes  Substance Use Topics   Alcohol use: Yes    Comment: occ   Drug use: No    Home Medications Prior to Admission medications   Medication Sig Start Date End Date Taking? Authorizing Provider  diclofenac Sodium (VOLTAREN) 1 % GEL Apply 4 g topically 4 (four) times daily. 08/21/21  Yes Julez Huseby S, PA  metroNIDAZOLE (FLAGYL) 500 MG tablet Take 1 tablet (500 mg total) by mouth 2 (two) times daily. One po bid x 7 days 10/01/14   Rolan Bucco, MD  metroNIDAZOLE (FLAGYL) 500 MG tablet Take 1 tablet (500 mg total)  by mouth 2 (two) times daily. One po bid x 7 days 02/15/15   Ladona Mow, PA-C    Allergies    Patient has no known allergies.  Review of Systems   Review of Systems  Constitutional:  Negative for fever.  HENT:  Negative for congestion.   Respiratory:  Negative for shortness of breath.   Cardiovascular:  Negative for chest pain.  Gastrointestinal:  Negative for abdominal distention.  Musculoskeletal:        R wrist / forearm pain  Neurological:  Negative for dizziness and headaches.   Physical Exam Updated Vital Signs BP (!) 105/54 (BP Location: Right Arm)   Pulse (!) 51   Temp 98.4 F (36.9 C) (Oral)   Resp 18   Ht 5\' 5"  (1.651 m)   Wt 54.4 kg   SpO2 100%   BMI 19.97 kg/m   Physical Exam Vitals and nursing note reviewed.  Constitutional:      General: She is not in acute distress.    Appearance: Normal appearance. She is not ill-appearing.  HENT:     Head: Normocephalic and atraumatic.  Eyes:     General: No scleral icterus.       Right eye: No discharge.        Left  eye: No discharge.     Conjunctiva/sclera: Conjunctivae normal.  Pulmonary:     Effort: Pulmonary effort is normal.     Breath sounds: No stridor.  Musculoskeletal:     Comments: Full range of motion of wrist, fingers FROM, no TTP of arm  Skin:    General: Skin is warm and dry.     Comments: No notable redness, warmth, tenderness to palpation of the right wrist or forearm.  There is no swelling there is no fluctuance.  Neurological:     Mental Status: She is alert and oriented to person, place, and time. Mental status is at baseline.    ED Results / Procedures / Treatments   Labs (all labs ordered are listed, but only abnormal results are displayed) Labs Reviewed - No data to display  EKG None  Radiology DG Forearm Right  Result Date: 08/21/2021 CLINICAL DATA:  Right forearm injury 10 days ago EXAM: RIGHT FOREARM - 2 VIEW COMPARISON:  None. FINDINGS: There is no evidence of fracture or  other focal bone lesions. Soft tissues are unremarkable. IMPRESSION: Negative. Electronically Signed   By: Wiliam Ke M.D.   On: 08/21/2021 14:36    Procedures Procedures   Medications Ordered in ED Medications - No data to display  ED Course  I have reviewed the triage vital signs and the nursing notes.  Pertinent labs & imaging results that were available during my care of the patient were reviewed by me and considered in my medical decision making (see chart for details).    MDM Rules/Calculators/A&P                           Patient is 30 year old female does frequent repetitive movements at work.  Is having some right wrist/distal forearm pain no appreciable redness swelling or warmth or tenderness to palpation.  There is no focal bony tenderness.  X-ray of forearm was obtained in triage is without any fracture or abnormality.  Finkelstein's is negative seems to be more of a overuse injury.  Recommended rest ice Tylenol ibuprofen Voltaren gel.  She will follow these recommendations follow-up with [facility] for physical therapy.  Final Clinical Impression(s) / ED Diagnoses Final diagnoses:  Right arm pain    Rx / DC Orders ED Discharge Orders          Ordered    diclofenac Sodium (VOLTAREN) 1 % GEL  4 times daily        08/21/21 1559             [place], [place] 08/21/21 1730    Vanetta Mulders, MD 08/23/21 2006

## 2021-08-21 NOTE — ED Notes (Signed)
Moderate swelling r distal medial forearm. Sx for 2 weeks. Gradual onset symptoms.

## 2021-08-21 NOTE — ED Triage Notes (Signed)
Right forearm swelling x 10 days after "scooping" something at work. Normal ROM

## 2021-08-21 NOTE — Discharge Instructions (Addendum)
Please rest ice and elevate your right arm. Specifically icing your wrist and the very end of your forearm should benefit this area of pain.  I recommend follow-up of an orthopedist as they can help you with physical therapy and help minimize recurrent symptoms.  Please use Tylenol or ibuprofen for pain.  You may use 600 mg ibuprofen every 6 hours or 1000 mg of Tylenol every 6 hours.  You may choose to alternate between the 2.  This would be most effective.  Not to exceed 4 g of Tylenol within 24 hours.  Not to exceed 3200 mg ibuprofen 24 hours.   As we discussed please return to the ER for any new or worsening symptoms such as swelling of your entire arm, blue or pale appearing fingertips or any other new or concerning symptoms.

## 2021-11-06 ENCOUNTER — Emergency Department (HOSPITAL_BASED_OUTPATIENT_CLINIC_OR_DEPARTMENT_OTHER): Payer: Medicaid Other

## 2021-11-06 ENCOUNTER — Other Ambulatory Visit: Payer: Self-pay

## 2021-11-06 ENCOUNTER — Emergency Department (HOSPITAL_BASED_OUTPATIENT_CLINIC_OR_DEPARTMENT_OTHER)
Admission: EM | Admit: 2021-11-06 | Discharge: 2021-11-06 | Disposition: A | Discharge status: Home / Self Care | Payer: Medicaid Other | Attending: Emergency Medicine | Admitting: Emergency Medicine

## 2021-11-06 ENCOUNTER — Encounter (HOSPITAL_BASED_OUTPATIENT_CLINIC_OR_DEPARTMENT_OTHER): Payer: Self-pay

## 2021-11-06 DIAGNOSIS — Z87891 Personal history of nicotine dependence: Secondary | ICD-10-CM | POA: Insufficient documentation

## 2021-11-06 DIAGNOSIS — S99922A Unspecified injury of left foot, initial encounter: Secondary | ICD-10-CM | POA: Insufficient documentation

## 2021-11-06 DIAGNOSIS — T1490XA Injury, unspecified, initial encounter: Secondary | ICD-10-CM

## 2021-11-06 DIAGNOSIS — W208XXA Other cause of strike by thrown, projected or falling object, initial encounter: Secondary | ICD-10-CM | POA: Diagnosis not present

## 2021-11-06 NOTE — Discharge Instructions (Signed)
It is recommended that you take Ibuprofen and Tylenol as needed for pain. Ice the toe as needed. While at home elevate your toe to help with pain/inflammation.   Have the gel nail polish removed at the nail salon and follow up with your PCP for further evaluation of your toe.   Return to the ED for any new/worsening symptoms

## 2021-11-06 NOTE — ED Provider Notes (Signed)
[facility] EMERGENCY DEPARTMENT Provider Note   CSN: [id number] Arrival date & time: 11/06/21  7824     History Chief Complaint  Patient presents with   Toe Injury    Gina Ortiz is a 30 y.o. female who presents to the ED Today with complaint of left great toe injury that occurred last night.  Patient reports that she was fixing the chain inside of her toilet.  She had remove the ceramic top lid and placed it on the toilet seat.  She states that the ceramic lid slid off falling directly onto her left great toe.  She states that she noticed a crack in the toenail and had some bleeding last night.  She states the bleeding has stopped.  She wanted to come to the ED today mostly to make sure she did not have any broken toes.  She has been complaining of pain to same.  She states she is up-to-date on tetanus.  She has no other complaints at this time.   The history is provided by the patient and medical records.      Past Medical History:  Diagnosis Date   Anemia     There are no problems to display for this patient.   Past Surgical History:  Procedure Laterality Date   CESAREAN SECTION       OB History   No obstetric history on file.     History reviewed. No pertinent family history.  Social History   Tobacco Use   Smoking status: Former    Packs/day: 0.50    Types: Cigars, Cigarettes  Substance Use Topics   Alcohol use: Yes    Comment: occ   Drug use: No    Home Medications Prior to Admission medications   Medication Sig Start Date End Date Taking? Authorizing Provider  diclofenac Sodium (VOLTAREN) 1 % GEL Apply 4 g topically 4 (four) times daily. 08/21/21   Gailen Shelter, PA  metroNIDAZOLE (FLAGYL) 500 MG tablet Take 1 tablet (500 mg total) by mouth 2 (two) times daily. One po bid x 7 days 10/01/14   Rolan Bucco, MD  metroNIDAZOLE (FLAGYL) 500 MG tablet Take 1 tablet (500 mg total) by mouth 2 (two) times daily. One po bid x 7 days 02/15/15    Ladona Mow, PA-C    Allergies    Patient has no known allergies.  Review of Systems   Review of Systems  Constitutional:  Negative for chills and fever.  Musculoskeletal:  Positive for arthralgias.  All other systems reviewed and are negative.  Physical Exam Updated Vital Signs BP 122/72 (BP Location: Right Arm)   Pulse (!) 53   Temp 98.1 F (36.7 C) (Oral)   Resp 18   Ht 5\' 5"  (1.651 m)   Wt 54.4 kg   LMP 10/19/2021 (Approximate)   SpO2 100%   BMI 19.97 kg/m   Physical Exam Vitals and nursing note reviewed.  Constitutional:      Appearance: She is not ill-appearing.  HENT:     Head: Normocephalic and atraumatic.  Eyes:     Conjunctiva/sclera: Conjunctivae normal.  Cardiovascular:     Rate and Rhythm: Normal rate and regular rhythm.  Pulmonary:     Effort: Pulmonary effort is normal.     Breath sounds: Normal breath sounds.  Musculoskeletal:     Comments: Left great toenail with gel nail polish applied.  There is a small crack noted in the mid distal portion of the nail itself.  There is no active bleeding and no dried blood to same.  She has some mild tenderness palpation over the distal phalanx.  Engine motion is intact to the toe.  2+ DP pulse.  Skin:    General: Skin is warm and dry.     Coloration: Skin is not jaundiced.  Neurological:     Mental Status: She is alert.    ED Results / Procedures / Treatments   Labs (all labs ordered are listed, but only abnormal results are displayed) Labs Reviewed - No data to display  EKG None  Radiology DG Foot Complete Left  Result Date: 11/06/2021 CLINICAL DATA:  Trauma to left great toe, pain EXAM: LEFT FOOT - COMPLETE 3+ VIEW COMPARISON:  None. FINDINGS: There is no evidence of fracture or dislocation. There is no evidence of arthropathy or other focal bone abnormality. Soft tissues are unremarkable. IMPRESSION: Negative. Electronically Signed   By: Larose Hires D.O.   On: 11/06/2021 09:55     Procedures Procedures   Medications Ordered in ED Medications - No data to display  ED Course  I have reviewed the triage vital signs and the nursing notes.  Pertinent labs & imaging results that were available during my care of the patient were reviewed by me and considered in my medical decision making (see chart for details).    MDM Rules/Calculators/A&P                           30 year old female presents to the ED today secondary to left great toenail injury where a ceramic toilet fell onto her toe last night.  On arrival to the ED today vitals are stable.  On exam patient has gel nail polish over nail.  Attempted to remove with nail polish without success.  Given that it is gel nail polish base it will need to be removed at the nail salon.  There is a small crack appreciated to the gel polish.  It is difficult to assess whether there is a crack in the nail itself.  She has some mild tenderness palpation to same.  An x-ray was obtained without any acute bony abnormalities.  She is neurovascularly intact.  Her tetanus is up-to-date.  I have recommended that she have the gel polish removed at the nail salon and to refrain from any more nail polish until her nail heals.  It is difficult to assess whether there is subungual hematoma underneath given the amount of nail polish.  She is recommended to take ibuprofen and Tylenol as needed for pain and to follow-up with her PCP for same.  She is in agreement with plan at this time and stable for discharge.   This note was prepared using Dragon voice recognition software and may include unintentional dictation errors due to the inherent limitations of voice recognition software.   Final Clinical Impression(s) / ED Diagnoses Final diagnoses:  Injury  Injury of toe on left foot, initial encounter    Rx / DC Orders ED Discharge Orders     None        Discharge Instructions      It is recommended that you take Ibuprofen and Tylenol  as needed for pain. Ice the toe as needed. While at home elevate your toe to help with pain/inflammation.   Have the gel nail polish removed at the nail salon and follow up with your PCP for further evaluation of your toe.   Return to  the ED for any new/worsening symptoms       Tanda Rockers, Cordelia Poche 11/06/21 1022    Pricilla Loveless, MD 11/06/21 [phone]

## 2021-11-06 NOTE — ED Triage Notes (Signed)
Pt states the ceramic top of toilet fell onto top of left great toe and cracked toenail last night.

## 2021-12-12 ENCOUNTER — Encounter (HOSPITAL_BASED_OUTPATIENT_CLINIC_OR_DEPARTMENT_OTHER): Payer: Self-pay | Admitting: *Deleted

## 2021-12-12 ENCOUNTER — Emergency Department (HOSPITAL_BASED_OUTPATIENT_CLINIC_OR_DEPARTMENT_OTHER)
Admission: EM | Admit: 2021-12-12 | Discharge: 2021-12-12 | Disposition: A | Discharge status: Home / Self Care | Payer: Medicaid Other | Attending: Emergency Medicine | Admitting: Emergency Medicine

## 2021-12-12 ENCOUNTER — Other Ambulatory Visit: Payer: Self-pay

## 2021-12-12 DIAGNOSIS — J111 Influenza due to unidentified influenza virus with other respiratory manifestations: Secondary | ICD-10-CM

## 2021-12-12 DIAGNOSIS — Z87891 Personal history of nicotine dependence: Secondary | ICD-10-CM | POA: Diagnosis not present

## 2021-12-12 DIAGNOSIS — R059 Cough, unspecified: Secondary | ICD-10-CM | POA: Diagnosis present

## 2021-12-12 NOTE — ED Provider Notes (Signed)
[facility] EMERGENCY DEPARTMENT Provider Note   CSN: [id number] Arrival date & time: 12/12/21  1210     History Chief Complaint  Patient presents with   Letter for School/Work    Gina Ortiz is a 30 y.o. female.  Presented to ER with request for work note.  She reports that about 2 weeks ago she started having flulike symptoms, cough, body aches, feeling generally unwell.  At an outside medical facility she was positive for the flu she reports.  She then started to feel better and has been symptom-free for the past week.  No medical complaint at present.  Feels totally back to her normal self.  States that the only reason she is in the ER today is because her work wanted her to be evaluated prior to returning to work.  HPI     Past Medical History:  Diagnosis Date   Anemia     There are no problems to display for this patient.   Past Surgical History:  Procedure Laterality Date   CESAREAN SECTION       OB History   No obstetric history on file.     No family history on file.  Social History   Tobacco Use   Smoking status: Former    Packs/day: 0.50    Types: Cigars, Cigarettes  Substance Use Topics   Alcohol use: Yes    Comment: occ   Drug use: No    Home Medications Prior to Admission medications   Medication Sig Start Date End Date Taking? Authorizing Provider  diclofenac Sodium (VOLTAREN) 1 % GEL Apply 4 g topically 4 (four) times daily. 08/21/21   Gailen Shelter, PA  metroNIDAZOLE (FLAGYL) 500 MG tablet Take 1 tablet (500 mg total) by mouth 2 (two) times daily. One po bid x 7 days 10/01/14   Rolan Bucco, MD  metroNIDAZOLE (FLAGYL) 500 MG tablet Take 1 tablet (500 mg total) by mouth 2 (two) times daily. One po bid x 7 days 02/15/15   Ladona Mow, PA-C    Allergies    Patient has no known allergies.  Review of Systems   Review of Systems  Constitutional:  Negative for chills and fever.  HENT:  Negative for ear pain and sore throat.    Eyes:  Negative for visual disturbance.  Respiratory:  Negative for cough and shortness of breath.   Cardiovascular:  Negative for chest pain and palpitations.  Gastrointestinal:  Negative for abdominal pain and vomiting.  Genitourinary:  Negative for dysuria and hematuria.  Musculoskeletal:  Negative for arthralgias and back pain.  Skin:  Negative for color change and rash.  Neurological:  Negative for seizures and syncope.  Psychiatric/Behavioral:  Negative for agitation and behavioral problems.   All other systems reviewed and are negative.  Physical Exam Updated Vital Signs BP 128/72 (BP Location: Right Arm)    Pulse 60    Temp 98.4 F (36.9 C) (Oral)    Resp 16    Ht 5\' 5"  (1.651 m)    Wt 54.4 kg    LMP 11/29/2021    SpO2 100%    BMI 19.96 kg/m   Physical Exam Vitals and nursing note reviewed.  Constitutional:      General: She is not in acute distress.    Appearance: She is well-developed.  HENT:     Head: Normocephalic and atraumatic.  Eyes:     Conjunctiva/sclera: Conjunctivae normal.  Cardiovascular:     Rate and Rhythm: Normal rate  and regular rhythm.     Heart sounds: No murmur heard. Pulmonary:     Effort: Pulmonary effort is normal. No respiratory distress.     Breath sounds: Normal breath sounds.  Abdominal:     Palpations: Abdomen is soft.     Tenderness: There is no abdominal tenderness.  Musculoskeletal:        General: No swelling.     Cervical back: Neck supple.  Skin:    General: Skin is warm and dry.     Capillary Refill: Capillary refill takes less than 2 seconds.  Neurological:     General: No focal deficit present.     Mental Status: She is alert.  Psychiatric:        Mood and Affect: Mood normal.    ED Results / Procedures / Treatments   Labs (all labs ordered are listed, but only abnormal results are displayed) Labs Reviewed - No data to display  EKG None  Radiology No results found.  Procedures Procedures   Medications Ordered  in ED Medications - No data to display  ED Course  I have reviewed the triage vital signs and the nursing notes.  Pertinent labs & imaging results that were available during my care of the patient were reviewed by me and considered in my medical decision making (see chart for details).    MDM Rules/Calculators/A&P                           30 year old lady presented to ER with concern for work note.  She has no medical complaints.  Given lack of medical complaint, do not see indication for any work-up in ER today.  Because she has been asymptomatic for the past few days, feel that she can return to work.  Discharged home.  After the discussed management above, the patient was determined to be safe for discharge.  The patient was in agreement with this plan and all questions regarding their care were answered.  ED return precautions were discussed and the patient will return to the ED with any significant worsening of condition.   Final Clinical Impression(s) / ED Diagnoses Final diagnoses:  Flu    Rx / DC Orders ED Discharge Orders     None        Lucrezia Starch, MD 12/12/21 1323

## 2021-12-12 NOTE — Discharge Instructions (Signed)
Follow-up with your primary doctor as needed.  Come back to ER as needed for any difficulty breathing or vomiting or other new concerning symptom.

## 2021-12-12 NOTE — ED Triage Notes (Signed)
She had the flu a week ago. She needs a work note.

## 2022-08-26 IMAGING — DX DG FOREARM 2V*R*
2 series · 2 of 2 positions shown · non-contrast
Comparison: None.

CLINICAL DATA: Right forearm injury 10 days ago

EXAM:
RIGHT FOREARM - 2 VIEW

[forearm ap]
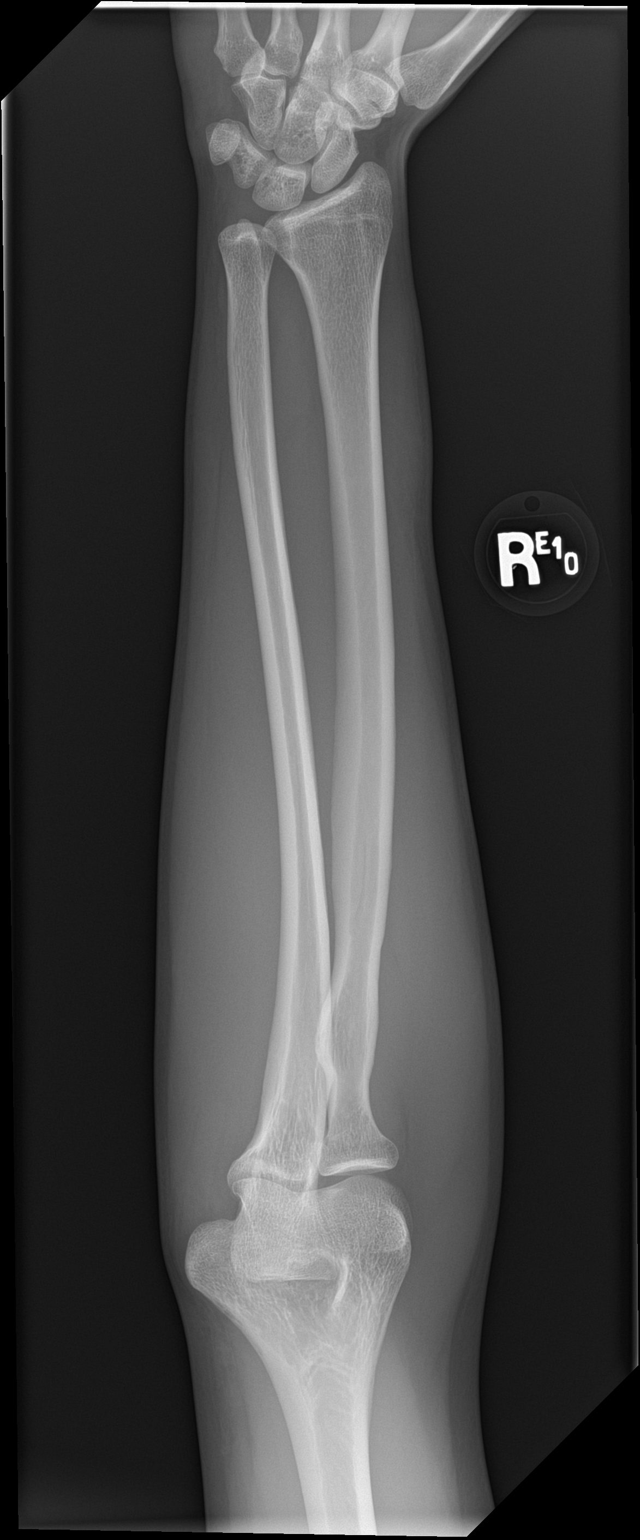

[forearm lat]
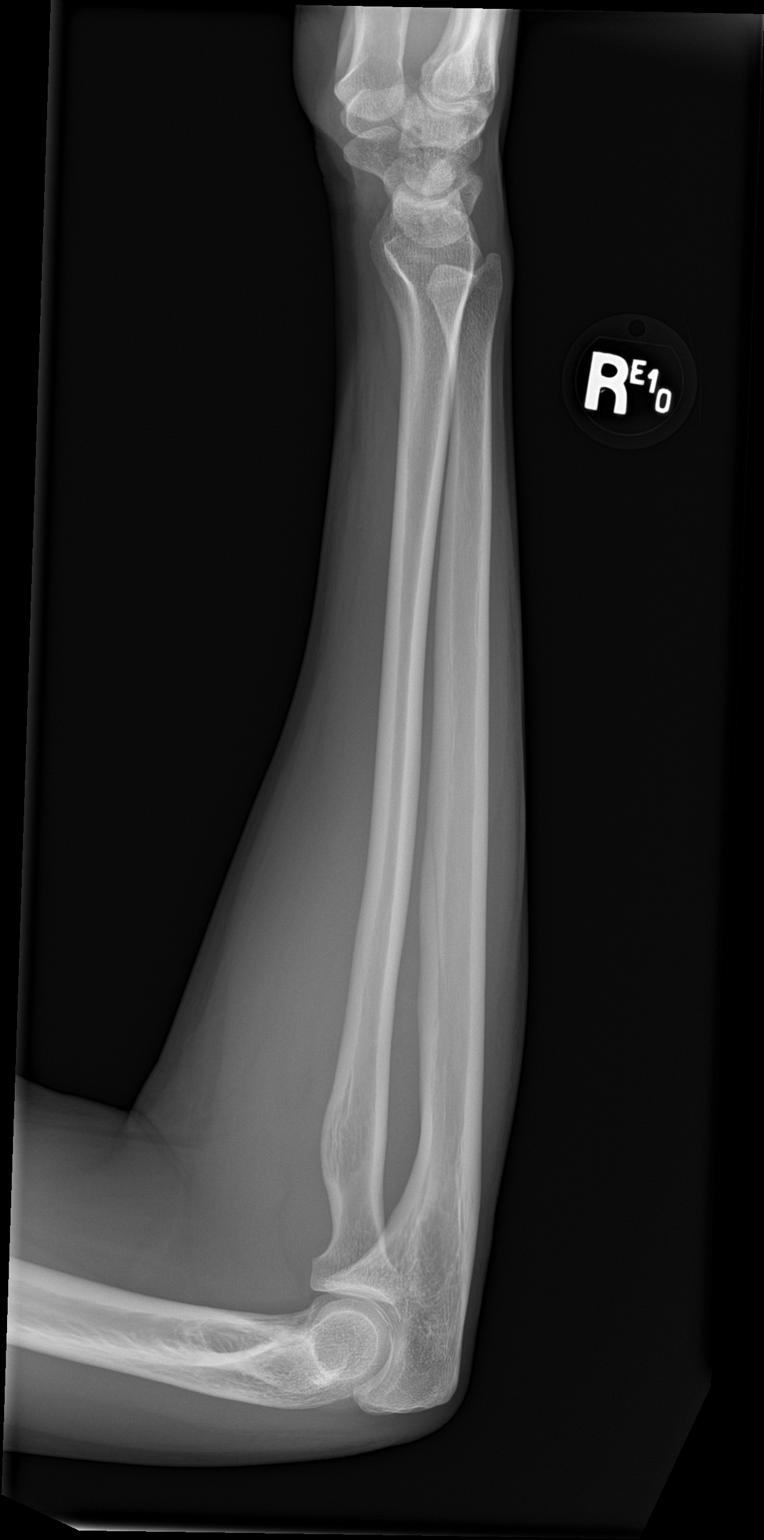

[2 of 2 positions shown; findings below may reference images not displayed]

FINDINGS: There is no evidence of fracture or other focal bone lesions. Soft
tissues are unremarkable.
IMPRESSION: Negative.

## 2022-11-11 IMAGING — CR DG FOOT COMPLETE 3+V*L*
3 series · 3 of 3 positions shown · non-contrast
Comparison: None.

CLINICAL DATA: Trauma to left great toe, pain

EXAM:
LEFT FOOT - COMPLETE 3+ VIEW

[t foot ap left]
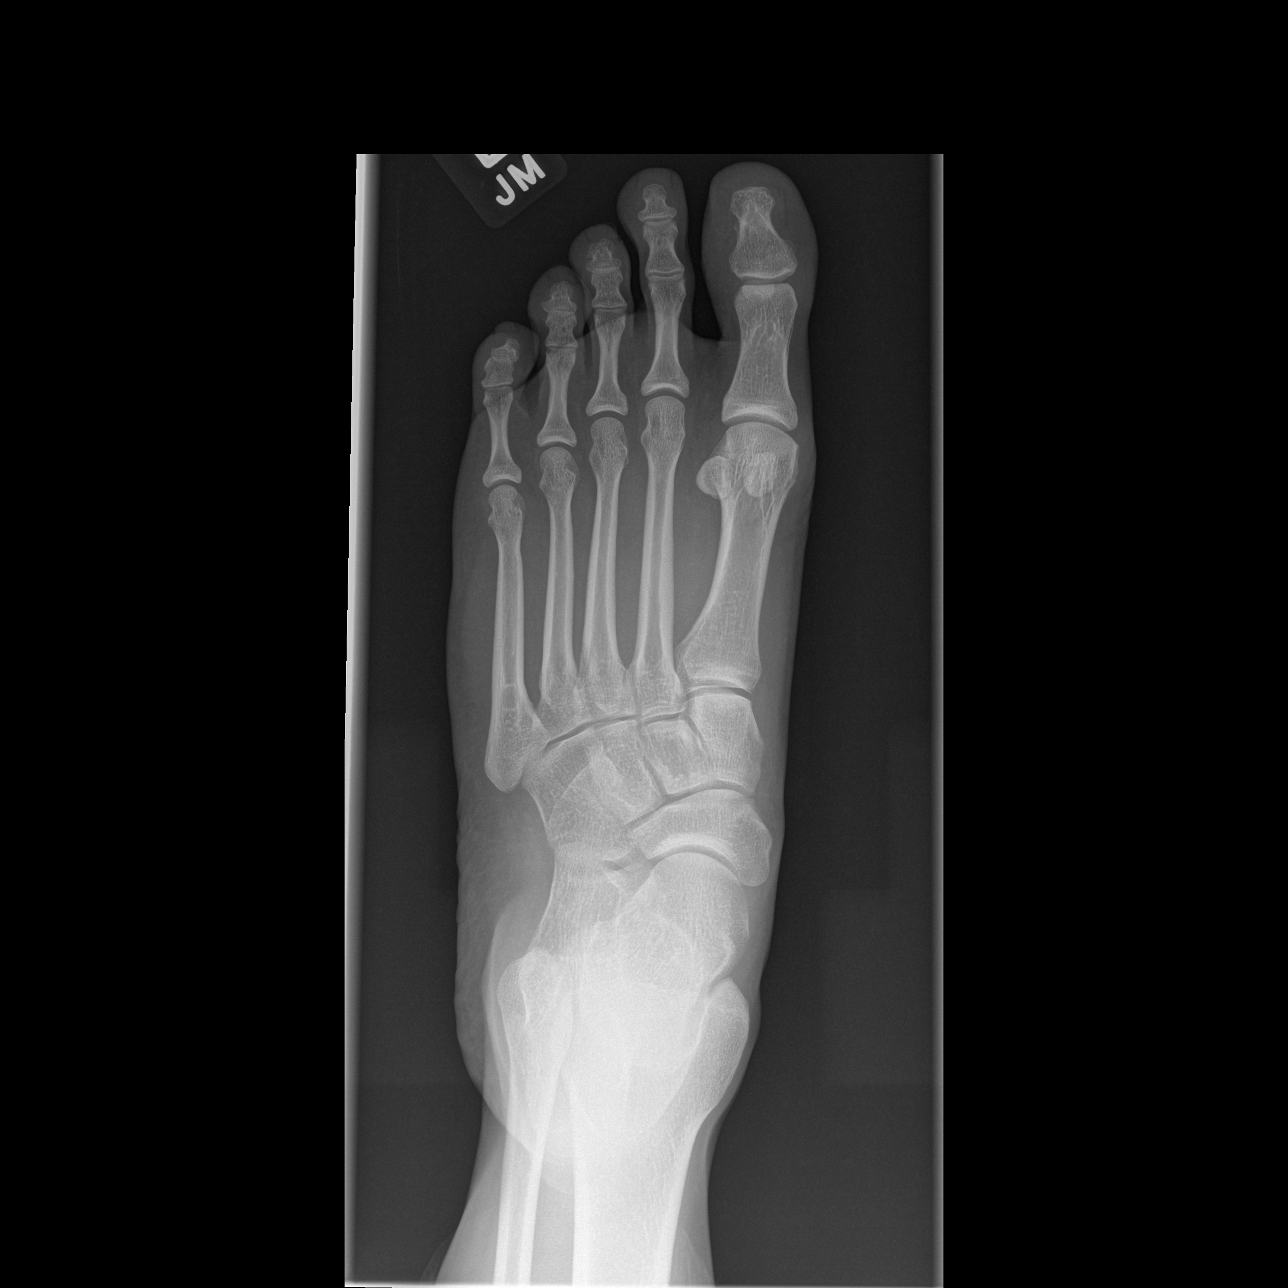

[t foot oblique left]
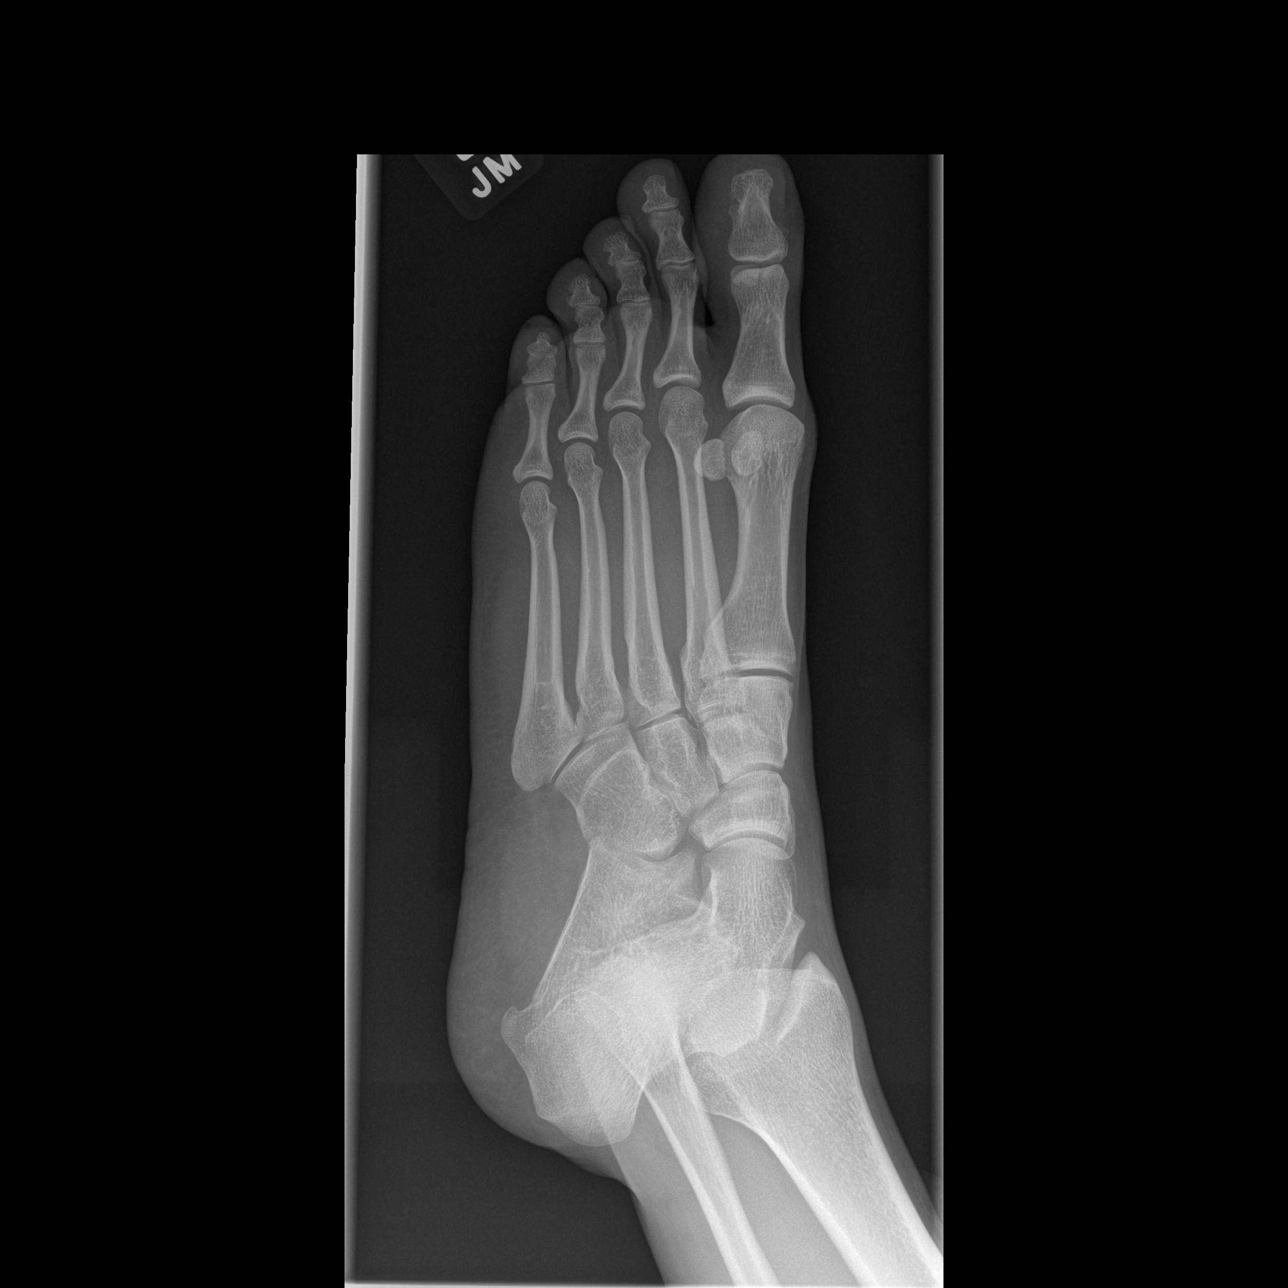

[t foot lat left]
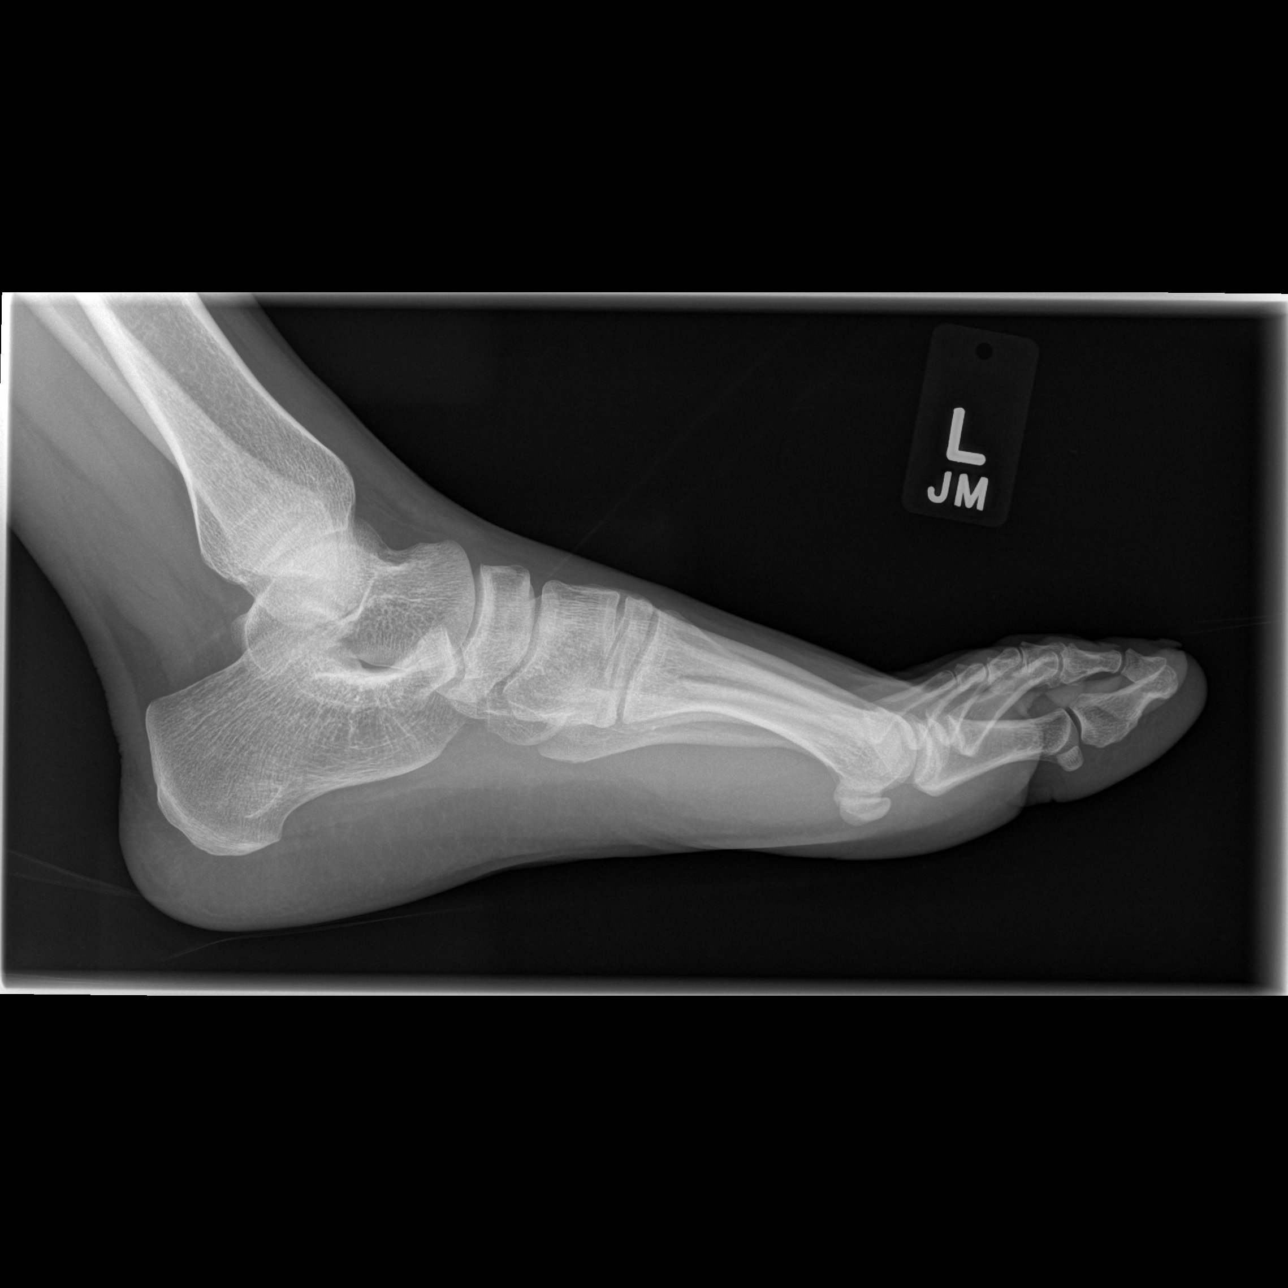

[3 of 3 positions shown; findings below may reference images not displayed]

FINDINGS: There is no evidence of fracture or dislocation. There is no
evidence of arthropathy or other focal bone abnormality. Soft
tissues are unremarkable.
IMPRESSION: Negative.

## 2024-08-10 ENCOUNTER — Emergency Department (HOSPITAL_BASED_OUTPATIENT_CLINIC_OR_DEPARTMENT_OTHER)
Admission: EM | Admit: 2024-08-10 | Discharge: 2024-08-10 | Disposition: A | Discharge status: Home / Self Care | Attending: Emergency Medicine | Admitting: Emergency Medicine

## 2024-08-10 ENCOUNTER — Emergency Department (HOSPITAL_BASED_OUTPATIENT_CLINIC_OR_DEPARTMENT_OTHER)

## 2024-08-10 ENCOUNTER — Other Ambulatory Visit: Payer: Self-pay

## 2024-08-10 ENCOUNTER — Encounter (HOSPITAL_BASED_OUTPATIENT_CLINIC_OR_DEPARTMENT_OTHER): Payer: Self-pay | Admitting: Emergency Medicine

## 2024-08-10 DIAGNOSIS — Z3A00 Weeks of gestation of pregnancy not specified: Secondary | ICD-10-CM | POA: Insufficient documentation

## 2024-08-10 DIAGNOSIS — O209 Hemorrhage in early pregnancy, unspecified: Secondary | ICD-10-CM | POA: Diagnosis present

## 2024-08-10 DIAGNOSIS — O2311 Infections of bladder in pregnancy, first trimester: Secondary | ICD-10-CM | POA: Diagnosis not present

## 2024-08-10 DIAGNOSIS — O4690 Antepartum hemorrhage, unspecified, unspecified trimester: Secondary | ICD-10-CM

## 2024-08-10 LAB — CBC
HCT: 38.6 % (ref 36.0–46.0)
Hemoglobin: 12.3 g/dL (ref 12.0–15.0)
MCH: 26.9 pg (ref 26.0–34.0)
MCHC: 31.9 g/dL (ref 30.0–36.0)
MCV: 84.3 fL (ref 80.0–100.0)
Platelets: 321 K/uL (ref 150–400)
RBC: 4.58 MIL/uL (ref 3.87–5.11)
RDW: 13.8 % (ref 11.5–15.5)
WBC: 7.8 K/uL (ref 4.0–10.5)
nRBC: 0 % (ref 0.0–0.2)

## 2024-08-10 LAB — URINALYSIS, ROUTINE W REFLEX MICROSCOPIC
Bilirubin Urine: NEGATIVE
Glucose, UA: NEGATIVE mg/dL
Ketones, ur: NEGATIVE mg/dL
Nitrite: NEGATIVE
Protein, ur: NEGATIVE mg/dL
Specific Gravity, Urine: 1.015 (ref 1.005–1.030)
pH: 7 (ref 5.0–8.0)

## 2024-08-10 LAB — URINALYSIS, MICROSCOPIC (REFLEX): RBC / HPF: >50 RBC/hpf (ref 0–5)

## 2024-08-10 LAB — HCG, QUANTITATIVE, PREGNANCY: hCG, Beta Chain, Quant, S: 18 m[IU]/mL — ABNORMAL HIGH (ref <5)

## 2024-08-10 MED ORDER — CEPHALEXIN 500 MG PO CAPS: 500.0000 mg | ORAL_CAPSULE | Freq: Two times a day (BID) | ORAL | 0 refills | Status: AC

## 2024-08-10 NOTE — ED Triage Notes (Signed)
 Pt c/o vaginal bleeding since last night, progressively worsening. Also reports lower abd pain, cramping since last night. Denies clots, n/v/d.   Reports currently pregnant.  G6P3-took home preg test, + end of July, has not established care for this pregnancy yet.

## 2024-08-10 NOTE — Discharge Instructions (Signed)
 Your pregnancy test was positive today.  Your beta-hCG was at 18, which estimates you are about [redacted] week along.  We could not see any pregnancy on your ultrasound.  This may mean it is just too early to see a pregnancy. However, this could also mean you are experiencing a miscarriage.   You need to follow up with your OBGYN at your appointment on Friday for a repeat beta-hCG which is the pregnancy hormone. The repeat test will allow us  to know if this is a miscarriage or a healthy pregnancy.   Your urine also showed signs of a potential urinary tract infection.  You have been prescribed Keflex  (cephalexin ). Take this antibiotic 2 times a day for the next 5 days. Take the full course of your antibiotic even if you start feeling better. Antibiotics may cause you to have diarrhea.  Please return to the ER if you have any severe vaginal bleeding that causes you to become very faint or dizzy, fevers, any other new or concerning symptoms

## 2024-08-10 NOTE — ED Provider Notes (Addendum)
  EMERGENCY DEPARTMENT AT [facility] Provider Note   CSN: [id number] Arrival date & time: 08/10/24  1050     Patient presents with: Vaginal Bleeding and Abdominal Pain   Gina Ortiz is a 33 y.o. female who presents with concern for lower abdominal cramping and vaginal pain that started last night into this morning.  Reports blood is noticeable when wiping, but she has not had to use any pads.  Denies any passage of clots.  Denies any urinary frequency or dysuria.  Denies any other abnormal vaginal discharge.  Denies any fever, nausea or vomiting.  Reports that her last menstrual period was at the end of June.  She reports a positive pregnancy test at the end of July.  She follows with OB/GYN at [facility].  She states she has an appointment this Friday, 08/14/2024    Vaginal Bleeding Associated symptoms: abdominal pain   Abdominal Pain Associated symptoms: vaginal bleeding        Prior to Admission medications   Medication Sig Start Date End Date Taking? Authorizing Provider  cephALEXin  (KEFLEX ) 500 MG capsule Take 1 capsule (500 mg total) by mouth 2 (two) times daily for 5 days. 08/10/24 08/15/24 Yes Veta Palma, PA-C  diclofenac  Sodium (VOLTAREN ) 1 % GEL Apply 4 g topically 4 (four) times daily. 08/21/21   Neldon Hamp RAMAN, PA    Allergies: Patient has no known allergies.    Review of Systems  Gastrointestinal:  Positive for abdominal pain.  Genitourinary:  Positive for vaginal bleeding.    Updated Vital Signs BP 129/83 (BP Location: Right Arm)   Pulse 72   Temp 98.4 F (36.9 C) (Oral)   Resp 15   Ht 5' 5 (1.651 m)   Wt 61.2 kg   LMP  (LMP Unknown)   SpO2 100%   BMI 22.47 kg/m   Physical Exam Vitals and nursing note reviewed. Exam conducted with a chaperone present.  Constitutional:      General: She is not in acute distress.    Appearance: She is well-developed.  HENT:     Head: Normocephalic and atraumatic.  Eyes:      Conjunctiva/sclera: Conjunctivae normal.  Cardiovascular:     Rate and Rhythm: Normal rate and regular rhythm.     Heart sounds: No murmur heard. Pulmonary:     Effort: Pulmonary effort is normal. No respiratory distress.     Breath sounds: Normal breath sounds.  Abdominal:     Palpations: Abdomen is soft.     Tenderness: There is no abdominal tenderness.  Genitourinary:    Comments: NT chaperone present for pelvic exam  Patient with moderate amount of blood in the vaginal vault.  No clots noticed.  Cervix is closed. Musculoskeletal:        General: No swelling.     Cervical back: Neck supple.  Skin:    General: Skin is warm and dry.     Capillary Refill: Capillary refill takes less than 2 seconds.  Neurological:     Mental Status: She is alert.  Psychiatric:        Mood and Affect: Mood normal.     (all labs ordered are listed, but only abnormal results are displayed) Labs Reviewed  HCG, QUANTITATIVE, PREGNANCY - Abnormal; Notable for the following components:      Result Value   hCG, Beta Chain, Quant, S 18 (*)    All other components within normal limits  URINALYSIS, ROUTINE W REFLEX MICROSCOPIC - Abnormal; Notable  for the following components:   APPearance CLOUDY (*)    Hgb urine dipstick LARGE (*)    Leukocytes,Ua TRACE (*)    All other components within normal limits  URINALYSIS, MICROSCOPIC (REFLEX) - Abnormal; Notable for the following components:   Bacteria, UA FEW (*)    All other components within normal limits  CBC    EKG: None  Radiology: US  OB LESS THAN 14 WEEKS WITH OB TRANSVAGINAL Result Date: 08/10/2024 CLINICAL DATA:  [id number] Vaginal bleeding [id number] EXAM: OBSTETRIC <14 WK US  AND TRANSVAGINAL OB US  TECHNIQUE: Both transabdominal and transvaginal ultrasound examinations were performed for complete evaluation of the gestation as well as the maternal uterus, adnexal regions, and pelvic cul-de-sac. Transvaginal technique was performed to assess early  pregnancy. COMPARISON:  None Available. FINDINGS: Intrauterine gestational sac: Not present, at this time. Yolk sac:  Not present, at this time. Fetal Pole:  Not present, at this time. Cardiac Activity: Not present, at this time. Subchorionic hemorrhage:  None visualized. Maternal uterus/adnexae: The uterus is within normal limits. Nonthickened endometrium measuring 6 mm. Neither ovary is enlarged. Thick-walled cystic structure in the left ovary, measuring 1.6 cm, possibly the developing corpus luteum. No free pelvic fluid. IMPRESSION: No intrauterine pregnancy. In the setting of a positive beta HCG, differential considerations include an early pregnancy, complete miscarriage, or an unseen ectopic pregnancy. Obstetric consultation with serial beta hCG and sonographic follow-up should be obtained, as clinically warranted. Electronically Signed   By: Rogelia Myers M.D.   On: 08/10/2024 14:58     Procedures   Medications Ordered in the ED - No data to display                                  Medical Decision Making Amount and/or Complexity of Data Reviewed Labs: ordered. Radiology: ordered.  Risk Prescription drug management.    Differential diagnosis includes but is not limited to Acute cholecystitis, cholelithiasis, cholangitis, choledocholithiasis, peptic ulcer, gastritis, gastroenteritis, appendicitis, IBS, IBD, DKA, nephrolithiasis, UTI, pyelonephritis, pancreatitis, diverticulitis, mesenteric ischemia, abdominal aortic aneurysm, small bowel obstruction, volvulus,  ovarian torsion and pregnancy related concerns in females of childbearing age    ED Course:  Upon initial evaluation, patient is well-appearing, stable vitals.  Abdomen is soft and nontender to palpation.  NT chaperone present for pelvic exam.  Patient with moderate amount of blood in the vaginal vault.  No clots.  Cervix closed.  Labs Ordered: I Ordered, and personally interpreted labs.  The pertinent results include:    CBC without leukocytosis.  Hemoglobin within normal limits at 12.3 Beta-hCG at 18 Urinalysis with cloudy appearance and few bacteria noted.  Large amount of red blood cells  Imaging Studies ordered: I ordered imaging studies including transvaginal ultrasound I independently visualized the imaging with scope of interpretation limited to determining acute life threatening conditions related to emergency care. Imaging showed  IMPRESSION: No intrauterine pregnancy. In the setting of a positive beta HCG, differential considerations include an early pregnancy, complete miscarriage, or an unseen ectopic pregnancy. Obstetric consultation with serial beta hCG and sonographic follow-up should be obtained, as clinically warranted. I agree with the radiologist interpretation   Medications Given: None  Upon re-evaluation, patient remains well-appearing with stable vitals.  Discussed that ultrasound did not show a pregnancy, and cervix was closed on exam. Given elevated beta hcg, discussed that this could be a miscarriage or that this could still be a viable pregnancy.  Does not have any signs of acute blood loss with hemoglobin stable.  No leukocytosis or fever to suggest infection. She does have an appointment with her OB/GYN in 5 days on 08/15/2024.  She understands she needs to attend this appointment to have a repeat beta-hCG obtained and for further evaluation.  Her urine did show some bacteria, and given reported abdominal cramping, will treat for acute cystitis with Keflex .  No leukocytosis, fevers, low back pain, no concern for pyelonephritis. Low concern for other acute intraabdominal pathologies such as appendicitis, diverticulitis, given abdomen soft nontender, stable vitals, no leukocytosis.   Patient stable and appropriate for discharge home at this time    Impression: Vaginal bleeding in pregnancy Acute cystitis  Disposition:  The patient was discharged home with instructions to  attend her OB/GYN appointment on 08/14/2024 for repeat beta-hCG and further management.  Take course of Keflex  as prescribed. Return precautions given.   This chart was dictated using voice recognition software, Dragon. Despite the best efforts of this provider to proofread and correct errors, errors may still occur which can change documentation meaning.       Final diagnoses:  Vaginal bleeding in pregnancy  Acute cystitis during pregnancy in first trimester    ED Discharge Orders          Ordered    cephALEXin  (KEFLEX ) 500 MG capsule  2 times daily        08/10/24 1540               Veta Palma, PA-C 08/10/24 1550    Veta Palma, PA-C 08/10/24 1551    Jerrol Agent, MD 08/10/24 1623
